# Patient Record
Sex: Male | Born: 1950 | Race: White | Hispanic: No | Marital: Married | Smoking: Former smoker
Health system: Southern US, Community
[De-identification: ages and names within clinical notes are randomized; demographics above are authoritative.]

## PROBLEM LIST (undated history)

## (undated) DIAGNOSIS — I4891 Unspecified atrial fibrillation: Secondary | ICD-10-CM

## (undated) DIAGNOSIS — G4733 Obstructive sleep apnea (adult) (pediatric): Secondary | ICD-10-CM

## (undated) DIAGNOSIS — R7303 Prediabetes: Secondary | ICD-10-CM

## (undated) DIAGNOSIS — U071 COVID-19: Secondary | ICD-10-CM

## (undated) DIAGNOSIS — H35039 Hypertensive retinopathy, unspecified eye: Secondary | ICD-10-CM

## (undated) DIAGNOSIS — M47816 Spondylosis without myelopathy or radiculopathy, lumbar region: Secondary | ICD-10-CM

## (undated) DIAGNOSIS — I10 Essential (primary) hypertension: Secondary | ICD-10-CM

## (undated) DIAGNOSIS — E782 Mixed hyperlipidemia: Secondary | ICD-10-CM

## (undated) HISTORY — DX: COVID-19: U07.1

## (undated) HISTORY — DX: Unspecified atrial fibrillation: I48.91

## (undated) HISTORY — DX: Prediabetes: R73.03

## (undated) HISTORY — DX: Mixed hyperlipidemia: E78.2

## (undated) HISTORY — DX: Essential (primary) hypertension: I10

## (undated) HISTORY — DX: Spondylosis without myelopathy or radiculopathy, lumbar region: M47.816

## (undated) HISTORY — PX: TONSILLECTOMY AND ADENOIDECTOMY: SUR1326

## (undated) HISTORY — PX: VASECTOMY: SHX75

## (undated) HISTORY — DX: Obstructive sleep apnea (adult) (pediatric): G47.33

## (undated) HISTORY — PX: REPLACEMENT TOTAL KNEE: SUR1224

## (undated) HISTORY — DX: Hypertensive retinopathy, unspecified eye: H35.039

## (undated) HISTORY — PX: CERVICAL SPINE SURGERY: SHX589

---

## 2005-03-15 ENCOUNTER — Ambulatory Visit (HOSPITAL_COMMUNITY): Admission: RE | Admit: 2005-03-15 | Discharge: 2005-03-15 | Payer: Self-pay | Admitting: Orthopedic Surgery

## 2007-11-13 ENCOUNTER — Ambulatory Visit: Payer: Self-pay | Admitting: Unknown Physician Specialty

## 2012-03-20 ENCOUNTER — Ambulatory Visit: Payer: Self-pay | Admitting: General Practice

## 2012-03-20 LAB — URINALYSIS, COMPLETE
Bilirubin,UR: NEGATIVE
Blood: NEGATIVE
Ketone: NEGATIVE
Leukocyte Esterase: NEGATIVE
Nitrite: NEGATIVE
Ph: 5 (ref 4.5–8.0)
Protein: NEGATIVE
RBC,UR: <1 /HPF (ref 0–5)
Specific Gravity: 1.02 (ref 1.003–1.030)
WBC UR: <1 /HPF (ref 0–5)

## 2012-03-20 LAB — BASIC METABOLIC PANEL
BUN: 20 mg/dL — ABNORMAL HIGH (ref 7–18)
Chloride: 107 mmol/L (ref 98–107)
Creatinine: 0.91 mg/dL (ref 0.60–1.30)
EGFR (African American): >60
EGFR (Non-African Amer.): >60
Glucose: 96 mg/dL (ref 65–99)
Osmolality: 289 (ref 275–301)
Potassium: 3.8 mmol/L (ref 3.5–5.1)
Sodium: 144 mmol/L (ref 136–145)

## 2012-03-20 LAB — APTT: Activated PTT: 26.4 secs (ref 23.6–35.9)

## 2012-03-20 LAB — CBC
MCH: 29.3 pg (ref 26.0–34.0)
MCHC: 32.5 g/dL (ref 32.0–36.0)
MCV: 90 fL (ref 80–100)
Platelet: 177 10*3/uL (ref 150–440)
RDW: 12.1 % (ref 11.5–14.5)

## 2012-03-20 LAB — SEDIMENTATION RATE: Erythrocyte Sed Rate: 4 mm/hr (ref 0–20)

## 2012-03-20 LAB — PROTIME-INR: INR: 0.8

## 2012-03-21 LAB — URINE CULTURE

## 2012-10-17 ENCOUNTER — Ambulatory Visit: Payer: Self-pay | Admitting: General Practice

## 2012-10-17 LAB — URINALYSIS, COMPLETE
Bilirubin,UR: NEGATIVE
Ketone: NEGATIVE
Leukocyte Esterase: NEGATIVE
Nitrite: NEGATIVE
Ph: 5 (ref 4.5–8.0)
Protein: NEGATIVE
RBC,UR: 1 /HPF (ref 0–5)
WBC UR: 2 /HPF (ref 0–5)

## 2012-10-17 LAB — BASIC METABOLIC PANEL
Anion Gap: 6 — ABNORMAL LOW (ref 7–16)
Calcium, Total: 8.9 mg/dL (ref 8.5–10.1)
Chloride: 109 mmol/L — ABNORMAL HIGH (ref 98–107)
Co2: 31 mmol/L (ref 21–32)
Creatinine: 0.74 mg/dL (ref 0.60–1.30)
Osmolality: 292 (ref 275–301)
Potassium: 3.7 mmol/L (ref 3.5–5.1)

## 2012-10-17 LAB — CBC
HGB: 15.5 g/dL (ref 13.0–18.0)
MCH: 29 pg (ref 26.0–34.0)
MCHC: 33.9 g/dL (ref 32.0–36.0)
MCV: 86 fL (ref 80–100)
Platelet: 170 10*3/uL (ref 150–440)
RBC: 5.36 10*6/uL (ref 4.40–5.90)
RDW: 14.5 % (ref 11.5–14.5)

## 2012-10-17 LAB — PROTIME-INR: Prothrombin Time: 12 secs (ref 11.5–14.7)

## 2012-10-17 LAB — MRSA PCR SCREENING

## 2012-10-19 LAB — URINE CULTURE

## 2012-11-01 ENCOUNTER — Inpatient Hospital Stay: Payer: Self-pay | Admitting: General Practice

## 2012-11-02 LAB — BASIC METABOLIC PANEL
Anion Gap: 6 — ABNORMAL LOW (ref 7–16)
Calcium, Total: 8.4 mg/dL — ABNORMAL LOW (ref 8.5–10.1)
EGFR (African American): >60
EGFR (Non-African Amer.): 58 — ABNORMAL LOW
Osmolality: 284 (ref 275–301)
Sodium: 142 mmol/L (ref 136–145)

## 2012-11-02 LAB — HEMOGLOBIN: HGB: 13 g/dL (ref 13.0–18.0)

## 2012-11-03 LAB — BASIC METABOLIC PANEL
Anion Gap: 2 — ABNORMAL LOW (ref 7–16)
BUN: 11 mg/dL (ref 7–18)
EGFR (African American): >60
EGFR (Non-African Amer.): >60
Glucose: 122 mg/dL — ABNORMAL HIGH (ref 65–99)
Osmolality: 275 (ref 275–301)
Potassium: 3.6 mmol/L (ref 3.5–5.1)

## 2012-11-03 LAB — HEMOGLOBIN: HGB: 12.4 g/dL — ABNORMAL LOW (ref 13.0–18.0)

## 2013-01-04 ENCOUNTER — Ambulatory Visit: Payer: Self-pay | Admitting: General Practice

## 2013-01-04 LAB — BASIC METABOLIC PANEL
Chloride: 107 mmol/L (ref 98–107)
Co2: 26 mmol/L (ref 21–32)
Creatinine: 0.74 mg/dL (ref 0.60–1.30)
EGFR (African American): >60
EGFR (Non-African Amer.): >60
Glucose: 96 mg/dL (ref 65–99)
Osmolality: 279 (ref 275–301)
Potassium: 3.6 mmol/L (ref 3.5–5.1)

## 2013-01-04 LAB — MRSA PCR SCREENING

## 2013-01-04 LAB — CBC
HCT: 44 % (ref 40.0–52.0)
MCH: 28.9 pg (ref 26.0–34.0)
MCHC: 33.8 g/dL (ref 32.0–36.0)
MCV: 86 fL (ref 80–100)
Platelet: 158 10*3/uL (ref 150–440)
RDW: 14 % (ref 11.5–14.5)
WBC: 8.5 10*3/uL (ref 3.8–10.6)

## 2013-01-04 LAB — APTT: Activated PTT: 28.1 secs (ref 23.6–35.9)

## 2013-01-04 LAB — URINALYSIS, COMPLETE
Nitrite: NEGATIVE
Ph: 7 (ref 4.5–8.0)
Specific Gravity: 1.009 (ref 1.003–1.030)
Squamous Epithelial: <1
WBC UR: NONE SEEN /HPF (ref 0–5)

## 2013-01-04 LAB — PROTIME-INR
INR: 0.9
Prothrombin Time: 12.5 secs (ref 11.5–14.7)

## 2013-01-17 ENCOUNTER — Inpatient Hospital Stay: Payer: Self-pay | Admitting: General Practice

## 2013-01-18 LAB — BASIC METABOLIC PANEL
Anion Gap: 6 — ABNORMAL LOW (ref 7–16)
BUN: 7 mg/dL (ref 7–18)
Calcium, Total: 8.3 mg/dL — ABNORMAL LOW (ref 8.5–10.1)
Chloride: 99 mmol/L (ref 98–107)
Co2: 30 mmol/L (ref 21–32)
EGFR (African American): >60
EGFR (Non-African Amer.): >60
Glucose: 137 mg/dL — ABNORMAL HIGH (ref 65–99)
Potassium: 3.5 mmol/L (ref 3.5–5.1)
Sodium: 135 mmol/L — ABNORMAL LOW (ref 136–145)

## 2013-01-18 LAB — HEMOGLOBIN: HGB: 13.3 g/dL (ref 13.0–18.0)

## 2013-01-19 LAB — BASIC METABOLIC PANEL
BUN: 11 mg/dL (ref 7–18)
Co2: 29 mmol/L (ref 21–32)
Creatinine: 1.05 mg/dL (ref 0.60–1.30)
EGFR (African American): >60
Glucose: 108 mg/dL — ABNORMAL HIGH (ref 65–99)
Potassium: 3.8 mmol/L (ref 3.5–5.1)
Sodium: 140 mmol/L (ref 136–145)

## 2013-01-19 LAB — HEMOGLOBIN: HGB: 12.9 g/dL — ABNORMAL LOW (ref 13.0–18.0)

## 2013-10-18 IMAGING — CR DG KNEE 1-2V*L*
1 series · 2 of 2 positions shown · non-contrast
Comparison: none

REASON FOR EXAM: postop
COMMENTS:   Bedside (portable):Y

PROCEDURE:     DXR - DXR KNEE LEFT AP AND LATERAL  - January 17, 2013 [DATE]
RESULT:     Comparison: None.

[Series 1: ap · 0.17mm/px · 2 of 2 slices shown]
[im 1/2]
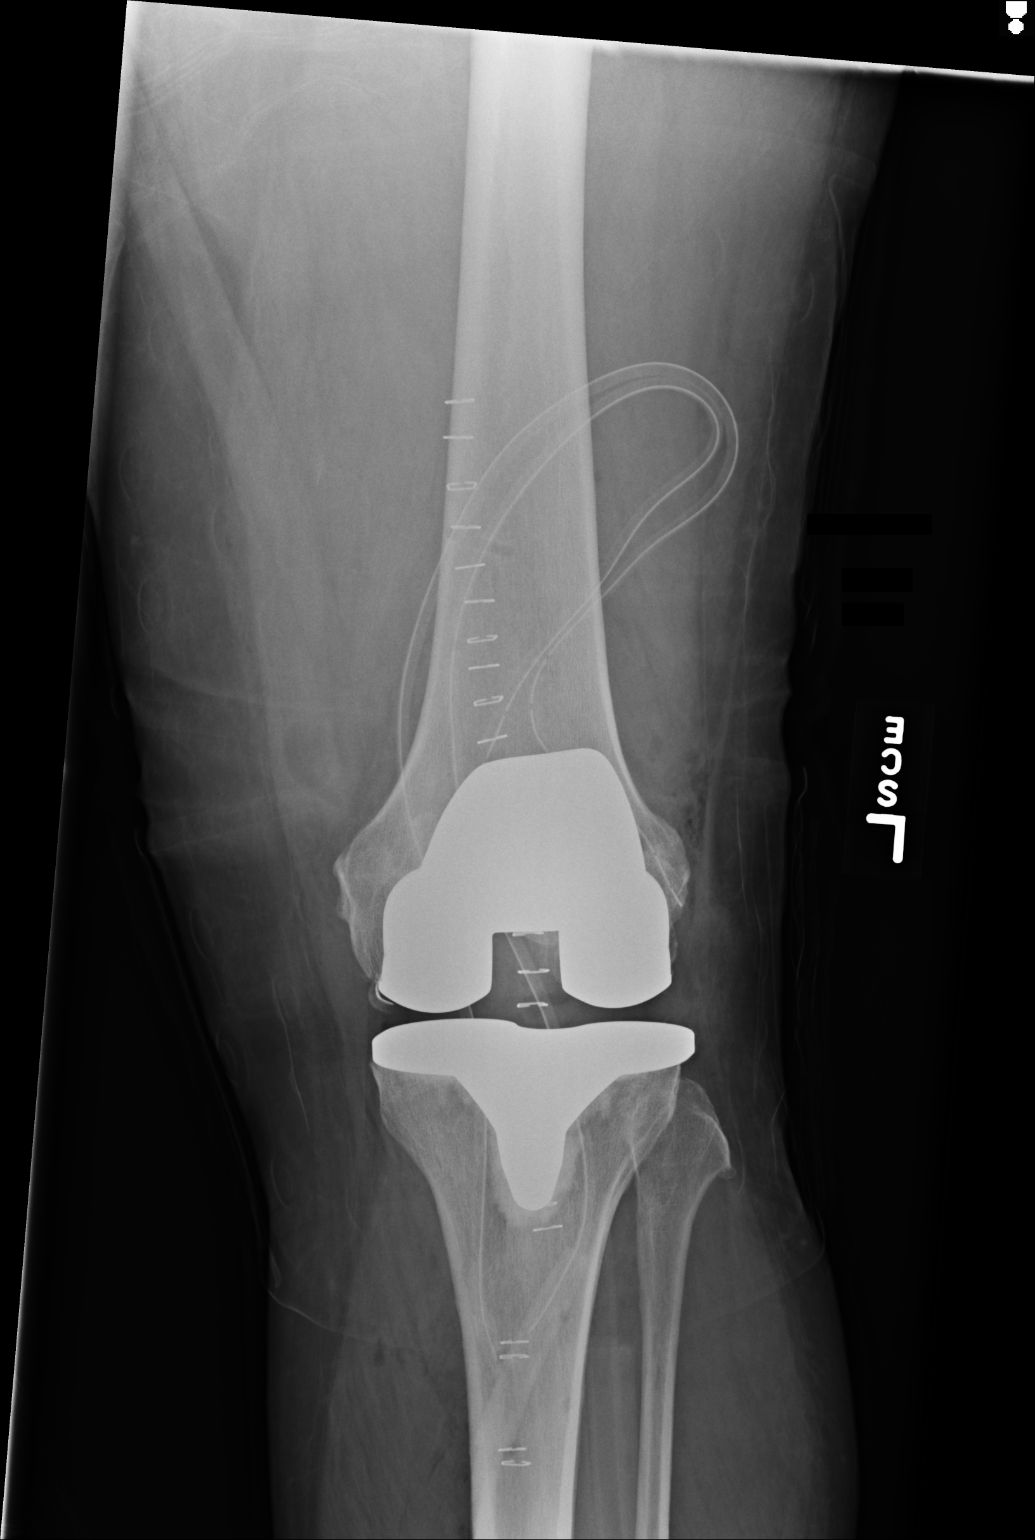
[im 2/2]
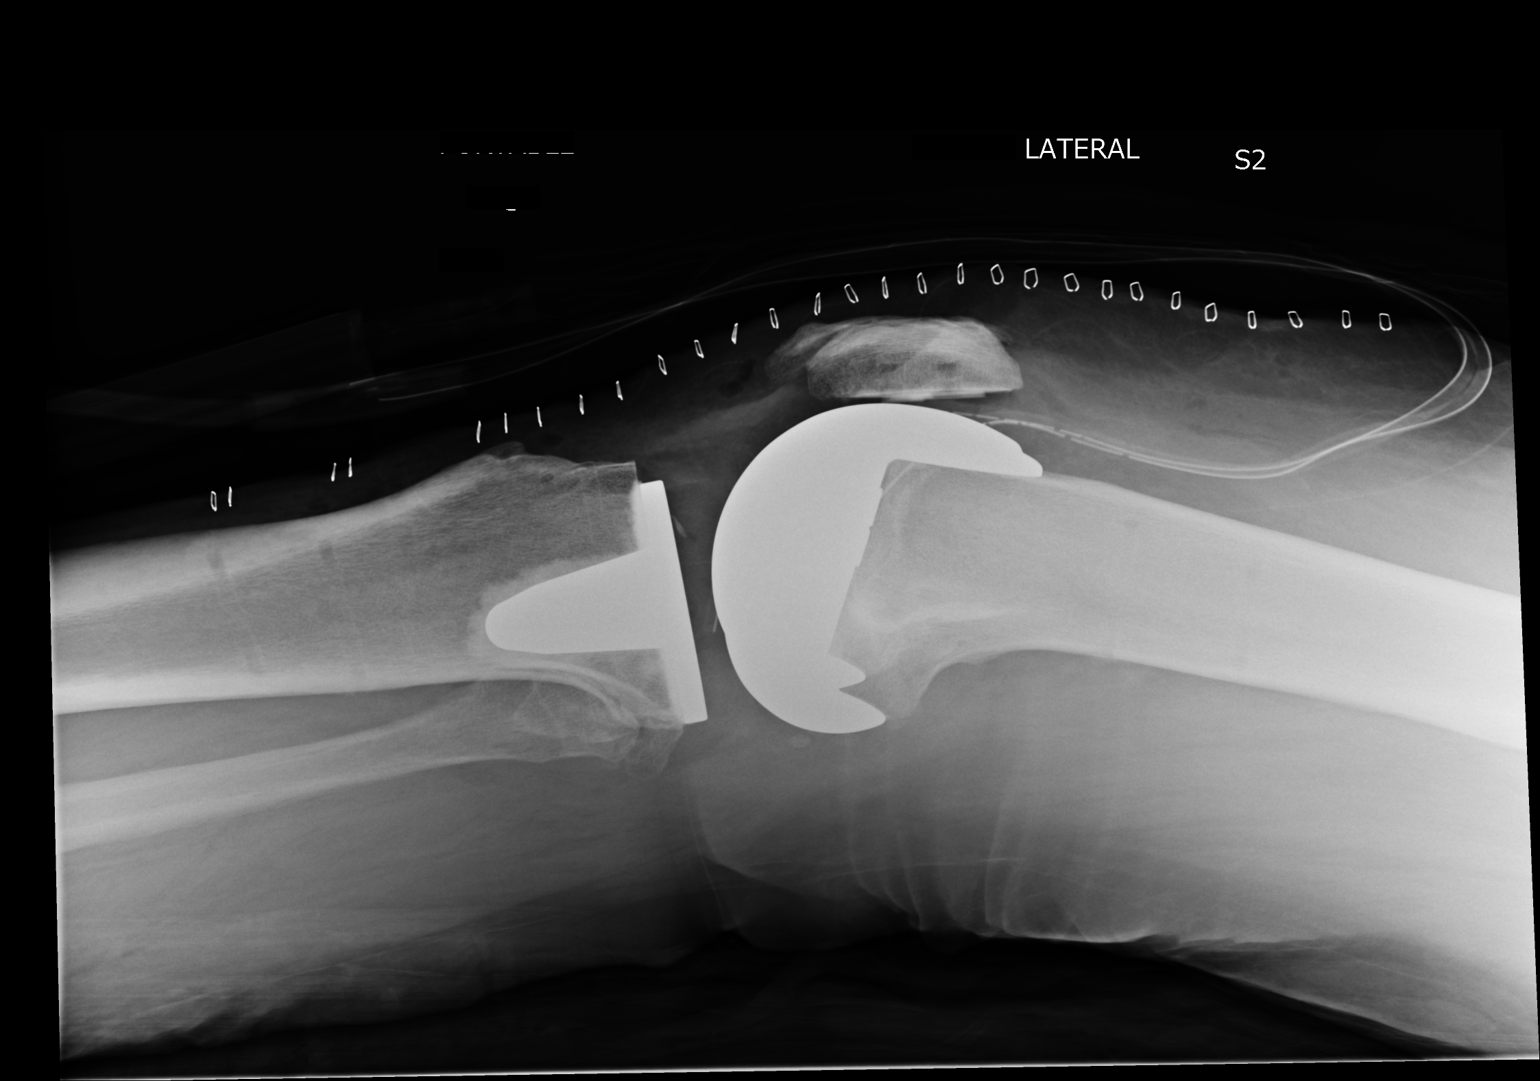

[2 of 2 positions shown; findings below may reference images not displayed]

FINDINGS: Hardware seen from total knee arthroplasty. Surgical drain and skin staples
are present. Soft tissue gas is likely postoperative.
IMPRESSION: Total knee arthroplasty.

[REDACTED]

## 2015-04-01 NOTE — Discharge Summary (Signed)
PATIENT NAME:  Gary Morales, Gary Morales MR#:  [id number] DATE OF BIRTH:  Oct 08, 1951  DATE OF ADMISSION:  11/01/2012 DATE OF DISCHARGE:  11/04/2012  Surgeon: Jeneen Rinks P. Holley Bouche., MD  ADMITTING DIAGNOSIS: Degenerative arthrosis of the right knee.   DISCHARGE DIAGNOSIS: Degenerative arthrosis of the right knee.   HISTORY: The patient is a 64 year old male who has been followed at [facility] for progression of right knee pain. He was initially scheduled to undergo surgery back in April of this year, but secondary to a fall down a set of steps and injured neck which required surgery, this was postponed. The patient has a long history of bilateral knee pain with the right being more symptomatic than the left. The patient states that he had had a remote history of knee arthroscopy to both knees. His pain was localized mostly along the medial aspect of the knee. He had reported some near giving way as well as occasional swelling of the knee. He denied any gross locking of the knee. At the time of surgery, he was not using any type of ambulatory aid. His pain was noted to be aggravated with weight-bearing activities. He was having difficulty with ambulating upstairs. The patient had not seen any improvement in his condition despite the use of anti-inflammatory medications, Synvisc and osteoarthritic brace. The pain had progressed to the point that it was significantly interfering with his activities of daily living. X-rays taken in [facility] showed narrowing of the medial cartilage space with associated varus alignment. There was now osteophyte as well as subchondral sclerosis. After discussion of the risks and benefits of surgical intervention, the patient expressed his understanding of the risks and benefits and agreed with plans for surgical intervention.   PROCEDURE: Right total knee arthroplasty using computer-assisted navigation.   Anesthesia: Femoral nerve block with spinal.   Soft tissue  release: Anterior cruciate ligament, posterior cruciate ligament, deep and superficial medial collateral ligaments, as well as the patellofemoral ligament.   Implants utilized: DePuy PFC Sigma size 5 posterior stabilized femoral component (cemented), size 4 MBT tibial component (cemented), 38 mm three pegged oval dome patella (cemented), and a 12.5 mm stabilized rotating platform polyethylene insert.   HOSPITAL COURSE: The patient tolerated the procedure very well. He had no complications. He was then taken to the PAC-U where he was stabilized and then transferred to the Orthopedic floor. The patient began receiving anticoagulation therapy of Lovenox 40 mg subcutaneous every 12 hours per Anesthesia and Pharmacy protocol. He was fitted with TED stockings bilaterally. These were allowed to be removed one hour per eight hour shift. The right one was applied on day two following removal of the Hemovac and dressing change. He was also fitted with the AV-I compression foot pumps set at 80 mmHg. His calves have been nontender. There has been no evidence of any deep venous thromboses of the lower extremity. Heels were elevated off the bed using rolled towels.   The patient has denied any chest pains or shortness of breath. Vital signs have been stable. He has been afebrile. Hemodynamically he was stable. No transfusions were given other than the Autovac transfusions given the first six hours postoperatively.   Physical therapy was initiated on day one for gait training and transfers. He has done very well. Upon being discharged, he was ambulating greater than 200 feet. He was independent with bed to chair transfers. He was able go up and down four sets of steps independently. Occupational therapy was also initiated  on day one for activities of daily living and assistive devices.   The patient's IV, Foley and Hemovac were all discontinued on day two along with a dressing change. The Polar Care was reapplied to the  surgical leg to maintain a temperature of 40 to 50 degrees Fahrenheit. The wound was free of any drainage or signs of infection.   DISPOSITION: The patient is being discharged to home in improved stable condition.   DISCHARGE INSTRUCTIONS:  1. He may weight bear as tolerated. Continue using a walker until cleared by Physical Therapy to go to a quad cane.  2. He will receive Home Health physical therapy.  3. Continue wearing the TED stockings. These are to be worn during the day but may be removed at night.  4. Continue using Polar Care pretty much around-the-clock except for therapy. Maintain a temperature of 40 to 50 degrees Fahrenheit.  5. He has a follow-up appointment in the clinic in two weeks.  6. He is to call the clinic sooner if any temperatures of 101.5 or greater. The physical therapist will evaluate the wound as needed.  7. He is placed on a regular diet.  8. He is to resume his regular medications that he was on prior to admission.  9. He was given a prescription for Roxicodone 5 to 10 mg every 4 to 6 hours p.r.n. for pain, Ultram 50 mg, 1 to 2 tablets every 4 to 6 hours p.r.n. for pain, and Lovenox 40 mg subcutaneously daily for 14 days, then discontinue and begin taking one enteric-coated aspirin per day.   PAST MEDICAL HISTORY:  1. Seasonal allergies.  2. Chickenpox. 3. Hernia.  4. Hypertension. 5. Arthritis.  6. Hemorrhoids.  7. Hypercholesterolemia.  8. Erectile dysfunction.  9. Hyperglycemia.   ____________________________ Vance Peper, PA [id number]:[id number] D: 11/03/2012 07:51:52 ET T: 11/03/2012 12:28:26 ET JOB#: [id number]  cc: Vance Peper, PA, <Dictator> JON WOLFE PA ELECTRONICALLY SIGNED 11/11/2012 19:40

## 2015-04-01 NOTE — Op Note (Signed)
PATIENT NAME:  Gary Morales, Gary Morales MR#:  [id number] DATE OF BIRTH:  [birth year]  DATE OF PROCEDURE:  11/01/2012  PREOPERATIVE DIAGNOSIS: Degenerative arthrosis of the right knee.   POSTOPERATIVE DIAGNOSIS: Degenerative arthrosis of the right knee.   PROCEDURE PERFORMED: Right total knee arthroplasty using computer-assisted navigation.   SURGEON: Laurice Record. Holley Bouche., MD    ASSISTANT: Vance Peper, PA-C (required to maintain retraction throughout the procedure)   ANESTHESIA: Femoral nerve block and spinal.   ESTIMATED BLOOD LOSS: 75 mL.   FLUIDS REPLACED: 2200 mL of Crystalloid.   TOURNIQUET TIME: 109 minutes.   DRAINS: Two medium drains to reinfusion system.   SOFT TISSUE RELEASES: Anterior cruciate ligament, posterior cruciate ligament, deep and superficial medial collateral ligament, and patellofemoral ligament.   IMPLANTS UTILIZED: DePuy PFC Sigma size 5 posterior stabilized femoral component (cemented), size 4 MBT tibial component (cemented), 38 mm three peg oval dome patella (cemented), and a 12.5 mm stabilized rotating platform polyethylene insert.   INDICATIONS FOR SURGERY: The patient is a 64 year old male who has been seen for complaints of severe progressive right knee pain. X-rays demonstrated severe degenerative changes in tricompartmental fashion with relative varus alignment. The patient had not seen any significant improvement despite conservative nonsurgical intervention. After discussion of the risks and benefits of surgical intervention, the patient expressed his understanding of the risks and benefits and agreed with plans for surgical intervention.   PROCEDURE IN DETAIL: The patient was brought in the operating room and, after adequate femoral nerve block and spinal anesthesia was achieved, a tourniquet was placed on the patient's upper right thigh. The patient's right knee and leg were cleaned and prepped with alcohol and DuraPrep and draped in the usual sterile fashion. A  "time-out" was performed as per usual protocol. The right lower extremity was exsanguinated using an Esmarch and the tourniquet was inflated to 300 mmHg. Anterior longitudinal incision was made followed by a standard mid vastus approach. A large effusion was evacuated. The deep fibers of the medial collateral ligament were elevated in a subperiosteal fashion off the medial flare of the tibia so as to maintain a continuous soft tissue sleeve. The patella was subluxed laterally and the patellofemoral ligament was incised. Inspection of the knee demonstrated severe tricompartmental degenerative changes with prominent osteophytes and full thickness loss of articular cartilage. Osteophytes were debrided using a rongeur. Anterior and posterior cruciate ligaments were excised. Two 4.0 mm Schanz pins were inserted into the femur and into the tibia for attachment of the array of trackers used for computer-assisted navigation. Hip center was identified using circumduction technique. Distal landmarks were mapped using the computer. Distal femur and proximal tibia were mapped using the computer. Distal femoral cutting guide was positioned using computer-assisted navigation so as to achieve a 5 degree distal valgus cut. Cut was performed and verified using the computer. Distal femur was sized and it was felt that a size 5 femoral component was appropriate. Size 5 cutting guide was positioned and anterior cut was performed and verified using the computer. This was followed by completion of the posterior and chamfer cuts. Femoral cutting guide for the central box was then positioned and central box cut was performed.   Attention was then directed to the proximal tibia. Medial and lateral menisci were excised. The extramedullary tibial cutting guide was positioned using computer-assisted navigation so as to achieve 0 degree varus valgus alignment and 0 degree posterior slope. Cut was performed and verified using the computer.  Proximal tibia was sized  and it was felt that a size 4 tibial tray was appropriate. Tibial and femoral trials were inserted followed by insertion of a 10 mm polyethylene insert. The knee was felt to be tight medially. A Cobb elevator was used to elevate the superficial fibers of the medial collateral ligament. The 10 mm polyethylene was replaced by a 12.5 mm polyethylene trial. This allowed for excellent mediolateral soft tissue balancing both in full extension and in flexion. Finally, the patella was cut and prepared so as to accommodate a 38 mm three peg oval dome patella. Patellar trial was placed and the knee was placed through a range of motion. Excellent patellar tracking was appreciated.   Polyethylene trial was removed. Osteotome was used to remove osteophytes off of the posterior condyles of the femur. Femoral trial was then removed. Central post hole for the tibial component reamed followed by insertion of a keel punch. Tibial trials were then removed. Cut surfaces of bone were irrigated with copious amounts of normal saline with antibiotic solution using pulsatile lavage and then suctioned dry. Polymethyl methacrylate cement was prepared in the usual fashion using a vacuum mixer. Cement was applied to the cut surface of the proximal tibia as well as along the undersurface of a size 4 MBT tibial component. Tibial component was positioned and impacted into place. Excess cement was removed using freer elevators. Cement was then applied to the cut surface of the femur as well as along the posterior flanges of a size 5 posterior stabilized femoral component. Femoral component was positioned and impacted into place. Excess cement was removed using freer elevators. A 12.5 mm polyethylene trial was inserted and the knee was brought in full extension with steady axial compression applied. Finally, cement was applied to the backside of a 38 mm three peg oval dome patella and patellar component was positioned and  patellar clamp applied. Excess cement was removed using freer elevators.   After adequate curing of cement, the tourniquet was deflated after a total tourniquet time of 109 minutes. Hemostasis was achieved using electrocautery. The knee was irrigated with copious amounts of normal saline with antibiotic solution using pulsatile lavage and then suctioned dry. The knee was inspected for any cement debris. 30 mL of 0.25% Marcaine with epinephrine was injected along the posterior capsule. A 12.5 mm stabilized rotating platform polyethylene insert was inserted and the knee was placed through a range of motion. Excellent mediolateral soft tissue balancing was appreciated both in full extension and in flexion. Excellent patellar tracking was noted.   Two medium drains were placed in the wound bed and brought out through a separate stab incision to be attached to a reinfusion system. The medial parapatellar portion of the incision was reapproximated using interrupted sutures of #1 Vicryl. Subcutaneous tissue was approximated in layers using first #0 Vicryl followed #2-0 Vicryl. Skin was closed with skin staples. Sterile dressing was applied.   The patient tolerated the procedure well. He was transported to the recovery room in stable condition.   ____________________________ Laurice Record. Holley Bouche., MD jph:drc D: 11/02/2012 06:14:31 ET T: 11/02/2012 10:00:12 ET JOB#: [id number]  cc: Laurice Record. Holley Bouche., MD, <Dictator> Laurice Record Holley Bouche MD ELECTRONICALLY SIGNED 11/03/2012 17:59

## 2015-04-04 NOTE — Discharge Summary (Signed)
PATIENT NAME:  Gary Morales, Gary Morales MR#:  [id number] DATE OF BIRTH:  11-Sep-1951  DATE OF ADMISSION:  01/17/2013 DATE OF DISCHARGE:  01/19/2013  DICTATING FOR: Jeneen Rinks P. Holley Bouche., MD  ADMITTING DIAGNOSIS: Degenerative arthrosis of left knee.   DISCHARGE DIAGNOSIS: Degenerative arthrosis of left knee.   HISTORY: The patient is a pleasant 64 year old who has been followed at [facility] for progression of bilateral knee pains. He had previously undergone a right total knee arthroplasty in November 2013 and was doing extremely well. However, he continued to have discomfort to the left knee and decided to proceed with surgery on that knee. He had localized most of the pain along the medial aspect of the knee. His pain was noted to be aggravated with weightbearing activities. On occasion, the patient was noted to have some swelling. He states that his left knee has now become his primary limiting factor on his returning to his everyday activities. X-rays taken in the [facility] showed narrowing of the medial cartilage space with associated varus alignment. He was noted to have osteophyte as well as subchondral sclerosis. After discussion of the risks and benefits of surgical intervention, the patient expressed his understanding of the risks and benefits and agreed for plans for surgical intervention.   HOSPITAL COURSE:  PROCEDURE: Left total knee arthroplasty using computer-assisted navigation.   ANESTHESIA: Femoral nerve block with spinal.   SOFT TISSUE RELEASE: Anterior cruciate ligament, posterior cruciate ligament, deep medial collateral ligaments as well as the patellofemoral ligament.   IMPLANTS UTILIZED: DePuy PFC Sigma size 5 posterior stabilized femoral component (cemented), size 5 MBT tibial component (cemented), 38 mm 3-pegged oval dome patella (cemented), and a 10 mm stabilized rotating platform polyethylene insert.   The patient tolerated the procedure very  well. He had no complications. He was then taken to the PACU, where he was stabilized and then transferred to the orthopedic floor. The patient began receiving anticoagulation therapy of Lovenox 30 mg subcutaneous q.12 hours per anesthesia and pharmacy protocol. He was fitted with TED stockings bilaterally. These were allowed to be removed 1 hour per 8-hour shift. He was also fitted with the AVI compression foot pumps bilaterally set at 80 mmHg. His calves have been nontender. There has been no evidence of any DVTs to the lower extremities. Homans signs were negative. Heels were elevated off the bed using rolled towels.   The patient denied any chest pain or shortness of breath. Vital signs have been stable. He has been afebrile. Hemodynamically, no transfusions were given other than the Autovac transfusion given the first 6 hours postoperatively.    Physical therapy was initiated on day 1 for gait training and transfers. He has done extremely well. He has had no complications. Occupational therapy was also initiated on day 1 for ADLs and assistive devices.   The patient's IV, Foley and Hemovac were discontinued on day 2 along with a dressing change. The Polar Care was reapplied to the surgical leg, maintaining a temperature of 40 to 50 degrees Fahrenheit.   DISPOSITION: The patient is being discharged to home in improved stable condition.   DISCHARGE INSTRUCTIONS:  1. He may weight bear as tolerated. Continue using a walker until cleared by physical therapy to go to a quad cane.  2. He is also to continue with TED stockings. These are to be worn during the day, but may be removed at night.  3. He will receive home health PT.  4. Continue with Polar Care,  maintain temperature of 40 to 50 degrees Fahrenheit to the surgical leg. Recommend that he wear this around the clock as much as possible for the first 2 weeks.  5. The patient has a followup appointment in clinic on February 20th. He is to follow up  in clinic sooner if any temperatures of 101.5 or greater or excessive bleeding.  6. He is placed on a regular diet.  7. He is to resume his regular medication that he was on prior to admission.  8. He was given a prescription for oxycodone 5 to 10 mg q.4-6 hours p.r.n. for pain, tramadol 50 to 100 mg q.4-6 hours p.r.n. for pain and Lovenox 30 mg subcutaneously q.12 hours for 14 days and then discontinue and begin taking one 81 mg enteric-coated aspirin.   PAST MEDICAL HISTORY: Seasonal allergies, chickenpox, hernias, hypertension, arthritis, hemorrhoids, hypercholesterolemia, erectile dysfunction, hyperglycemia.   ____________________________ Vance Peper, PA jrw:OSi D: 01/19/2013 16:25:41 ET T: 01/20/2013 07:25:17 ET JOB#: [id number]  cc: Vance Peper, PA, <Dictator> Johm Pfannenstiel PA ELECTRONICALLY SIGNED 01/22/2013 7:38

## 2015-04-04 NOTE — Op Note (Signed)
PATIENT NAME:  Gary Morales, Gary Morales MR#:  [id number] DATE OF BIRTH:  April 10, 1951  DATE OF PROCEDURE:  01/17/2013  PREOPERATIVE DIAGNOSIS: Degenerative arthrosis of the left knee.   POSTOPERATIVE DIAGNOSIS: Degenerative arthrosis of the left knee.   PROCEDURE PERFORMED: Left total knee arthroplasty using computer-assisted navigation.   SURGEON: Rolene Arbour, M.D.   ASSISTANT: Vance Peper, PA-C (required to maintain retraction throughout the procedure)   ANESTHESIA: Femoral nerve block and spinal.   ESTIMATED BLOOD LOSS: 100 mL.   FLUIDS REPLACED:  2000 mL of crystalloid.   TOURNIQUET TIME:  95 minutes.   DRAINS: Two medium drains to reinfusion system.   SOFT TISSUE RELEASES:  Anterior cruciate ligament, posterior cruciate ligament, deep medial collateral ligament and patellofemoral ligament.   IMPLANTS UTILIZED:  DePuy PFC Sigma size 5 posterior stabilized femoral component (cemented), size 5 MBT tibial component (cemented), 38 mm, 3-peg oval dome patella (cemented) and a 10 mm stabilized rotating platform polyethylene insert.   INDICATIONS FOR SURGERY:  The patient is a 64 year old gentleman who has been seen for complaints of progressive left knee pain. X-rays demonstrated severe degenerative changes in tricompartmental fashion with relative varus deformity. The patient previously underwent right total knee arthroplasty with excellent results. After discussion of the risks and benefits of surgical intervention, the patient expressed understanding of the risks, benefits and agreed with plans for surgical intervention.   PROCEDURE IN DETAIL: The patient in the operating room and, after adequate femoral nerve block and spinal anesthesia was achieved, a tourniquet was placed on the patient's upper left thigh. The patient's left knee and leg were cleaned and prepped with alcohol and DuraPrep and draped in the usual sterile fashion. A "timeout" was performed as per usual protocol. The left  lower extremity was exsanguinated using an Esmarch and the tourniquet was inflated to 300 mmHg.  An anterior longitudinal incision was made followed by a standard mid vastus approach. A moderate effusion was evacuated. The deep fibers of the medial collateral ligament were elevated in a subperiosteal fashion off the medial flare of the tibia so as to maintain a continuous soft tissue sleeve. The patella was subluxed laterally and the patellofemoral ligament was incised. Inspection of the knee demonstrated severe degenerative changes with full thickness loss of articular cartilage to both the medial, lateral and patellofemoral compartments. Prominent osteophytes were debrided using a rongeur. Anterior and posterior cruciate ligaments were excised. Two 4.0 mm Schanz pins were inserted into the femur and into the tibia for attachment of the array of trackers used for computer-assisted navigation. Hip center was identified using circumduction technique. Distal landmarks were mapped using the computer. The distal femur and proximal tibia were mapped using the computer.   Distal femoral cutting guide was positioned using computer-assisted navigation so as to achieve a 5-degree distal valgus cut. Cut was performed and verified using the computer. Distal femur was sized and it was felt that a size 5 femoral component was appropriate. A size five cutting 5 guide was positioned and the anterior cut was performed and verified using the computer. This was followed by completion of the posterior and chamfer cuts. Femoral cutting guide for central box was then positioned and the central box cut was performed.   Attention was then directed to the proximal tibia. Medial and lateral menisci were excised. The extramedullary tibial cutting guide was positioned using computer-assisted navigation so as to achieve a 0-degree varus valgus alignment and 0-degree posterior slope. Cut was performed and verified using the  computer. The  proximal tibia was sized and it was felt that a size 5 tibial tray was appropriate. Tibial and femoral trials were inserted followed by insertion of a 10 mm polyethylene trial. Excellent medial and lateral soft tissue balancing was appreciated both in full extension and in flexion. Finally, the patella was cut and prepared so as to accommodate a 38 mm, 3-peg oval dome patella. Patellar trial was placed and the knee was placed through a range of motion with excellent patellar tracking appreciated.   Femoral trial was removed after debridement of posterior osteophytes. The central post hole for the tibial component was reamed followed by insertion of a keel punch. Tibial tray was then removed. The cut surfaces of bone were irrigated with copious amounts of normal saline with antibiotic solution using pulsatile lavage and then suctioned dry. Polymethyl methacrylate cement with gentamicin was prepared in the usual fashion using a vacuum mixer. Cement was applied to the cut surface of the proximal tibia as well as along the undersurface of the size 5 MBT tibial component. The tibial component was positioned and impacted into place. Excess cement was removed using freer elevators. Cement was then applied to the cut surfaces of the femur as well as along the posterior flanges of a size 5 posterior stabilized femoral component. Femoral component was positioned and impacted into place. Excess cement was removed using freer elevators. A 10 mm polyethylene trial was inserted and the knee was brought into full extension with steady axial compression applied. Finally, cement was applied to the backside of a 38 mm, 3-peg oval dome patella and the patellar component was positioned and patellar clamp applied. Excess cement was removed using freer elevators.   After adequate curing of cement, the tourniquet was deflated after a total tourniquet time of 95 minutes. Hemostasis was achieved using electrocautery. The knee was  irrigated with copious amounts of normal saline with antibiotic solution using pulsatile lavage and then suctioned dry. The knee was inspected for any residual cement debris. 30 mL of 0.25% Marcaine with epinephrine was injected along the posterior capsule. A 10 mm stabilized rotating platform polyethylene insert was inserted and the knee was placed through a range of motion with excellent mediolateral soft tissue balancing appreciated and excellent patellar tracking noted. Two medium drains were placed in the wound bed and brought out through a separate stab incision to be attached to a reinfusion system. The medial parapatellar portion of the incision was reapproximated using a suture of #1 Vicryl. The subcutaneous tissue was approximated in layers using first #0 Vicryl followed by 2-0 Vicryl. Skin was closed with skin staples. A sterile dressing was applied. The patient tolerated the procedure well. He was transported to the recovery room in stable condition.    ____________________________ Laurice Record. Holley Bouche., MD jph:ct D: 01/17/2013 11:23:33 ET T: 01/17/2013 11:54:04 ET JOB#: [id number]  cc: Jeneen Rinks P. Holley Bouche., MD, <Dictator> Laurice Record Holley Bouche MD ELECTRONICALLY SIGNED 01/19/2013 17:44

## 2015-12-14 HISTORY — PX: CATARACT EXTRACTION: SUR2

## 2019-12-28 ENCOUNTER — Other Ambulatory Visit: Payer: Self-pay

## 2019-12-28 ENCOUNTER — Encounter (INDEPENDENT_AMBULATORY_CARE_PROVIDER_SITE_OTHER): Payer: Self-pay | Admitting: Ophthalmology

## 2019-12-28 ENCOUNTER — Ambulatory Visit (INDEPENDENT_AMBULATORY_CARE_PROVIDER_SITE_OTHER): Payer: Managed Care, Other (non HMO) | Admitting: Ophthalmology

## 2019-12-28 DIAGNOSIS — H3581 Retinal edema: Secondary | ICD-10-CM

## 2019-12-28 DIAGNOSIS — H35033 Hypertensive retinopathy, bilateral: Secondary | ICD-10-CM

## 2019-12-28 DIAGNOSIS — I10 Essential (primary) hypertension: Secondary | ICD-10-CM

## 2019-12-28 DIAGNOSIS — H4312 Vitreous hemorrhage, left eye: Secondary | ICD-10-CM | POA: Diagnosis not present

## 2019-12-28 DIAGNOSIS — H43812 Vitreous degeneration, left eye: Secondary | ICD-10-CM

## 2019-12-28 DIAGNOSIS — Z961 Presence of intraocular lens: Secondary | ICD-10-CM

## 2019-12-28 NOTE — Progress Notes (Signed)
[facility] Note  12/28/2019     CHIEF COMPLAINT Patient presents for Retina Evaluation   HISTORY OF PRESENT ILLNESS: Gary Morales is a 69 y.o. male who presents to the clinic today for:   HPI    Retina Evaluation    In left eye.  This started 1 week ago.  Duration of 1 week.  Associated Symptoms Floaters and Flashes.  Negative for Blind Spot, Photophobia, Scalp Tenderness, Fever, Pain, Glare, Jaw Claudication, Weight Loss, Distortion, Redness, Trauma, Shoulder/Hip pain and Fatigue.  Context:  distance vision, mid-range vision and near vision.  Treatments tried include no treatments.  I, the attending physician,  performed the HPI with the patient and updated documentation appropriately.          Comments    Patient states had sudden onset of flashes OS, about 1 week ago. Flashes lasted for about 2 hours. 1-2 days later, patient started having hazy vision OS and it looked like spider webs floating in vision OS. Saw doctor in [place] 2 days ago, vision was OK in both eyes. Patient states spider webs in vision are about the same as when symptoms started. Had high Rx for glasses prior to cataract surgery but he doesn't remember if he was near of far-sighted.       Last edited by Bernarda Caffey, MD on 12/28/2019  9:00 AM. (History)    pt is here on the referral of Dr. Rosana Hoes for sudden on set flashes and floaters, pt states he has had floaters since 2017 when Dr. Herbert Deaner did cataract sx (OU) on him, pt states he had a routine exam with Dr. Radford Pax on a Thursday, he states on Friday he started noticing a "golden halo" in his left eye vision that only lasted for an hour, he states Saturday he starting noticing floaters that looked like a "spider web", he went back to Dr. Theodosia Blender office on Wednesday and they told him that he has significantly more floaters in his left eye than he had the week before  Referring physician: Leticia Clas, [facility]  [address],  [place] [address]  HISTORICAL INFORMATION:   Selected notes from the MEDICAL RECORD NUMBER    CURRENT MEDICATIONS: Current Outpatient Medications (Ophthalmic Drugs)  Medication Sig  . tetrahydrozoline 0.05 % ophthalmic solution Place into both eyes as needed.   No current facility-administered medications for this visit. (Ophthalmic Drugs)   Current Outpatient Medications (Other)  Medication Sig  . amLODipine-valsartan (EXFORGE) 5-320 MG tablet Take by mouth as directed.  Marland Kitchen aspirin 81 MG EC tablet Take 81 mg by mouth daily.  Marland Kitchen doxycycline (VIBRA-TABS) 100 MG tablet Take 100 mg by mouth daily.  . hydrochlorothiazide (HYDRODIURIL) 25 MG tablet Take 25 mg by mouth daily.  Marland Kitchen losartan (COZAAR) 100 MG tablet Take 100 mg by mouth daily.  . metroNIDAZOLE (METROGEL) 1 % gel as directed.  . Multiple Vitamin (MULTI-VITAMIN) tablet Take by mouth daily.  . predniSONE (STERAPRED UNI-PAK 21 TAB) 10 MG (21) TBPK tablet Take 10 mg by mouth daily.  . rosuvastatin (CRESTOR) 10 MG tablet Take 10 mg by mouth at bedtime.   No current facility-administered medications for this visit. (Other)      REVIEW OF SYSTEMS: ROS    Positive for: Skin, Eyes   Negative for: Constitutional, Gastrointestinal, Neurological, Genitourinary, Musculoskeletal, HENT, Endocrine, Cardiovascular, Respiratory, Psychiatric, Allergic/Imm, Heme/Lymph   Last edited by Roselee Nova D, COT on 12/28/2019  8:28  AM. (History)       ALLERGIES No Known Allergies  PAST MEDICAL HISTORY Past Medical History:  Diagnosis Date  . Hypertensive retinopathy    Past Surgical History:  Procedure Laterality Date  . CATARACT EXTRACTION Bilateral 2017   hecker, toric implants OU    FAMILY HISTORY Family History  Problem Relation Age of Onset  . Diabetes Father     SOCIAL HISTORY Social History   Tobacco Use  . Smoking status: Former Research scientist (life sciences)  . Smokeless tobacco: Former Network engineer Use Topics  . Alcohol use: Yes     Alcohol/week: 2.0 standard drinks    Types: 2 Cans of beer per week    Comment: daily  . Drug use: Not on file         OPHTHALMIC EXAM:  Base Eye Exam    Visual Acuity (Snellen - Linear)      Right Left   Dist [place] 20/30 -2 20/40 +1   Dist ph [place] 20/25 -2 20/30       Tonometry (Tonopen, 8:37 AM)      Right Left   Pressure 18 14       Pupils      Dark Light Shape React APD   Right 4 3 Round Slow None   Left 4 3 Round Slow None       Visual Fields (Counting fingers)      Left Right    Full Full       Extraocular Movement      Right Left    Full, Ortho Full, Ortho       Neuro/Psych    Oriented x3: Yes   Mood/Affect: Normal       Dilation    Both eyes: 1.0% Mydriacyl, 2.5% Phenylephrine @ 8:37 AM        Slit Lamp and Fundus Exam    Slit Lamp Exam      Right Left   Lids/Lashes Dermatochalasis - upper lid Dermatochalasis - upper lid   Conjunctiva/Sclera White and quiet White and quiet   Cornea Mild Arcus, mild EBMD, well healed temporal cataract wounds Mild Arcus, mild EBMD, well healed superior cataract wounds   Anterior Chamber Deep and quiet Deep and quiet   Iris Round and dilated Round and dilated   Lens PC IOL in good position, 1+ Posterior capsular opacification PC IOL in good position, trace Posterior capsular opacification   Vitreous Vitreous syneresis Vitreous syneresis, +RBC ant vit, Posterior vitreous detachment, Weiss ring, vitreous condensations       Fundus Exam      Right Left   Disc Pink and Sharp Pink and Sharp   C/D Ratio 0.6 0.65   Macula Flat, Good foveal reflex, Retinal pigment epithelial mottling, No heme or edema Flat, blunted foveal reflex, Retinal pigment epithelial mottling, drusen, No heme or edema   Vessels Mild Vascular attenuation, Tortuousity, mild AV crossing changes Mild Vascular attenuation, Tortuousity, mild AV crossing changes   Periphery Attached    Attached, punctate IRH at 0100         Refraction    Manifest  Refraction      Sphere Cylinder Axis Dist VA   Right -0.25 +1.00 142 20/25   Left -2.50 +1.00 080 20/30+1          IMAGING AND PROCEDURES  Imaging and Procedures for @TODAY @  OCT, Retina - OU - Both Eyes       Right Eye Quality was good. Central Foveal Thickness: 263.  Progression has no prior data. Findings include normal foveal contour, no IRF, no SRF.   Left Eye Quality was good. Central Foveal Thickness: 259. Progression has no prior data. Findings include normal foveal contour, no IRF, no SRF, retinal drusen  (Rare drusen, vitreous opacities).   Notes *Images captured and stored on drive  Diagnosis / Impression:  NFP, no IRF/SRF OU OS: +vitreous opacities  Clinical management:  See below  Abbreviations: NFP - Normal foveal profile. CME - cystoid macular edema. PED - pigment epithelial detachment. IRF - intraretinal fluid. SRF - subretinal fluid. EZ - ellipsoid zone. ERM - epiretinal membrane. ORA - outer retinal atrophy. ORT - outer retinal tubulation. SRHM - subretinal hyper-reflective material                 ASSESSMENT/PLAN:    ICD-10-CM   1. Posterior vitreous detachment of left eye  H43.812   2. Vitreous hemorrhage of left eye (HCC)  H43.12   3. Retinal edema  H35.81 OCT, Retina - OU - Both Eyes  4. Essential hypertension  I10   5. Hypertensive retinopathy of both eyes  H35.033   6. Pseudophakia of both eyes  Z96.1     1,2. Hemorrhagic PVD OS  - Onset ~1 wk -- presented to Dr. Leander Rams on 01.13.21  - Discussed findings and prognosis  - No RT or RD on 360 scleral depressed exam  - Reviewed s/s of RT/RD  - Strict return precautions for any such RT/RD signs/symptoms  - VH precautions reviewed -- minimize activities, keep head elevated, avoid ASA/NSAIDs/blood thinners as able  - F/U 3-4 weeks -- DFE/OCT  3. No retinal edema on exam or OCT  4,5. Hypertensive retinopathy OU  - discussed importance of tight BP control  - monitor  6. Pseudophakia  OU  - s/p CE/IOL OU (Dr. Herbert Deaner, 2017)  - doing well  - monitor   Ophthalmic Meds Ordered this visit:  No orders of the defined types were placed in this encounter.      Return for f/u 3-4 weeks, hemorrhagic PVD OS, DFE, OCT.  There are no Patient Instructions on file for this visit.   Explained the diagnoses, plan, and follow up with the patient and they expressed understanding.  Patient expressed understanding of the importance of proper follow up care.   This document serves as a record of services personally performed by Gardiner Sleeper, MD, PhD. It was created on their behalf by Ernest Mallick, OA, an ophthalmic assistant. The creation of this record is the provider's dictation and/or activities during the visit.    Electronically signed by: Ernest Mallick, OA 01.15.2021 9:11 AM   Gardiner Sleeper, M.D., Ph.D. Diseases & Surgery of the Retina and Vitreous [facility]  I have reviewed the above documentation for accuracy and completeness, and I agree with the above. Gardiner Sleeper, M.D., Ph.D. 12/30/19 9:11 AM    Abbreviations: M myopia (nearsighted); A astigmatism; H hyperopia (farsighted); P presbyopia; Mrx spectacle prescription;  CTL contact lenses; OD right eye; OS left eye; OU both eyes  XT exotropia; ET esotropia; PEK punctate epithelial keratitis; PEE punctate epithelial erosions; DES dry eye syndrome; MGD meibomian gland dysfunction; ATs artificial tears; PFAT's preservative free artificial tears; [place] nuclear sclerotic cataract; PSC posterior subcapsular cataract; ERM epi-retinal membrane; PVD posterior vitreous detachment; RD retinal detachment; DM diabetes mellitus; DR diabetic retinopathy; NPDR non-proliferative diabetic retinopathy; PDR proliferative diabetic retinopathy; CSME clinically significant macular edema; DME diabetic macular  edema; dbh dot blot hemorrhages; CWS cotton wool spot; POAG primary open angle glaucoma; C/D cup-to-disc ratio; HVF  humphrey visual field; GVF goldmann visual field; OCT optical coherence tomography; IOP intraocular pressure; BRVO Branch retinal vein occlusion; CRVO central retinal vein occlusion; CRAO central retinal artery occlusion; BRAO branch retinal artery occlusion; RT retinal tear; SB scleral buckle; PPV pars plana vitrectomy; VH Vitreous hemorrhage; PRP panretinal laser photocoagulation; IVK intravitreal kenalog; VMT vitreomacular traction; MH Macular hole;  NVD neovascularization of the disc; NVE neovascularization elsewhere; AREDS age related eye disease study; ARMD age related macular degeneration; POAG primary open angle glaucoma; EBMD epithelial/anterior basement membrane dystrophy; ACIOL anterior chamber intraocular lens; IOL intraocular lens; PCIOL posterior chamber intraocular lens; Phaco/IOL phacoemulsification with intraocular lens placement; [place] photorefractive keratectomy; LASIK laser assisted in situ keratomileusis; HTN hypertension; DM diabetes mellitus; COPD chronic obstructive pulmonary disease

## 2020-01-22 ENCOUNTER — Encounter (INDEPENDENT_AMBULATORY_CARE_PROVIDER_SITE_OTHER): Payer: Managed Care, Other (non HMO) | Admitting: Ophthalmology

## 2020-02-27 ENCOUNTER — Ambulatory Visit (INDEPENDENT_AMBULATORY_CARE_PROVIDER_SITE_OTHER): Payer: 59 | Admitting: Ophthalmology

## 2020-02-27 ENCOUNTER — Encounter (INDEPENDENT_AMBULATORY_CARE_PROVIDER_SITE_OTHER): Payer: Self-pay | Admitting: Ophthalmology

## 2020-02-27 VITALS — BP 159/80

## 2020-02-27 DIAGNOSIS — H35033 Hypertensive retinopathy, bilateral: Secondary | ICD-10-CM

## 2020-02-27 DIAGNOSIS — H43812 Vitreous degeneration, left eye: Secondary | ICD-10-CM

## 2020-02-27 DIAGNOSIS — Z961 Presence of intraocular lens: Secondary | ICD-10-CM

## 2020-02-27 DIAGNOSIS — H3581 Retinal edema: Secondary | ICD-10-CM | POA: Diagnosis not present

## 2020-02-27 DIAGNOSIS — H4312 Vitreous hemorrhage, left eye: Secondary | ICD-10-CM

## 2020-02-27 DIAGNOSIS — I10 Essential (primary) hypertension: Secondary | ICD-10-CM

## 2020-02-27 NOTE — Progress Notes (Addendum)
[facility] Note  02/27/2020     CHIEF COMPLAINT Patient presents for Retina Follow Up   HISTORY OF PRESENT ILLNESS: Gary Morales is a 69 y.o. male who presents to the clinic today for:   HPI    Retina Follow Up    Patient presents with  PVD.  In left eye.  This started 2 months ago.  Severity is moderate.  I, the attending physician,  performed the HPI with the patient and updated documentation appropriately.          Comments    Patient here for 2 month (6 days) retina follow up for  Hemorrhagic PVD OS. Patient states 6 days ago OS had an attack. Had huge floating cloud. [place] web[place], was just driving. Wasn't doing anything . Vision is fuzzy OS. OD really clear. Has like a film over entire eye. No eye pain.       Last edited by Bernarda Caffey, MD on 03/01/2020  9:31 PM. (History)    pt states after his last visit here, his eye cleared up and his vision was good, he states last Thursday he was riding in the car and his left eye "went to pieces", he states it looks like he is looking through cellophane, he denies seeing flashing lights, he states he has been on medication for blood pressure for about 20 years  Referring physician: Curlene Labrum, MD [address],  [place] [address]  HISTORICAL INFORMATION:   Selected notes from the [facility]: Current Outpatient Medications (Ophthalmic Drugs)  Medication Sig  . tetrahydrozoline 0.05 % ophthalmic solution Place into both eyes as needed.   No current facility-administered medications for this visit. (Ophthalmic Drugs)   Current Outpatient Medications (Other)  Medication Sig  . amLODipine-valsartan (EXFORGE) 5-320 MG tablet Take by mouth as directed.  Marland Kitchen aspirin 81 MG EC tablet Take 81 mg by mouth daily.  Marland Kitchen doxycycline (VIBRA-TABS) 100 MG tablet Take 100 mg by mouth daily.  . hydrochlorothiazide (HYDRODIURIL) 25 MG tablet Take 25 mg by mouth daily.  Marland Kitchen losartan  (COZAAR) 100 MG tablet Take 100 mg by mouth daily.  . metroNIDAZOLE (METROGEL) 1 % gel as directed.  . Multiple Vitamin (MULTI-VITAMIN) tablet Take by mouth daily.  . predniSONE (STERAPRED UNI-PAK 21 TAB) 10 MG (21) TBPK tablet Take 10 mg by mouth daily.  . rosuvastatin (CRESTOR) 10 MG tablet Take 10 mg by mouth at bedtime.   No current facility-administered medications for this visit. (Other)      REVIEW OF SYSTEMS: ROS    Positive for: Skin, Eyes   Negative for: Constitutional, Gastrointestinal, Neurological, Genitourinary, Musculoskeletal, HENT, Endocrine, Cardiovascular, Respiratory, Psychiatric, Allergic/Imm, Heme/Lymph   Last edited by Theodore Demark, COA on 02/27/2020  1:37 PM. (History)       ALLERGIES No Known Allergies  PAST MEDICAL HISTORY Past Medical History:  Diagnosis Date  . Hypertensive retinopathy    Past Surgical History:  Procedure Laterality Date  . CATARACT EXTRACTION Bilateral 2017   hecker, toric implants OU    FAMILY HISTORY Family History  Problem Relation Age of Onset  . Diabetes Father     SOCIAL HISTORY Social History   Tobacco Use  . Smoking status: Former Research scientist (life sciences)  . Smokeless tobacco: Former Network engineer Use Topics  . Alcohol use: Yes    Alcohol/week: 2.0 standard drinks    Types: 2 Cans of beer per week  Comment: daily  . Drug use: Not on file         OPHTHALMIC EXAM:  Base Eye Exam    Visual Acuity (Snellen - Linear)      Right Left   Dist [place] 20/30 -1 20/60   Dist ph [place] NI 20/40 -1       Tonometry (Tonopen, 1:32 PM)      Right Left   Pressure 17 14       Pupils      Dark Light Shape React APD   Right 4 3 Round Slow None   Left 4 3 Round Slow None       Visual Fields (Counting fingers)      Left Right    Full Full       Extraocular Movement      Right Left    Full, Ortho Full, Ortho       Neuro/Psych    Oriented x3: Yes   Mood/Affect: Normal       Dilation    Both eyes: 1.0% Mydriacyl,  2.5% Phenylephrine @ 1:32 PM        Slit Lamp and Fundus Exam    Slit Lamp Exam      Right Left   Lids/Lashes Dermatochalasis - upper lid Dermatochalasis - upper lid   Conjunctiva/Sclera White and quiet Trace Injection   Cornea Mild Arcus, mild EBMD, well healed temporal cataract wounds Mild Arcus, mild EBMD, well healed superior cataract wounds   Anterior Chamber Deep and quiet Deep and quiet   Iris Round and dilated Round and dilated   Lens PC IOL in good position, 1+ non-central Posterior capsular opacification PC IOL in good position, trace Posterior capsular opacification   Vitreous Vitreous syneresis Vitreous syneresis, +RBC ant vit, Posterior vitreous detachment, Weiss ring, blood stained vitreous condensations centrally, partially obscuring macula and disc       Fundus Exam      Right Left   Disc Pink and Sharp Pink and Sharp   C/D Ratio 0.6 0.65   Macula Flat, blunted foveal reflex, Retinal pigment epithelial mottling, No heme or edema Flat, blunted foveal reflex, Retinal pigment epithelial mottling, drusen, No heme or edema   Vessels Mild Vascular attenuation, Tortuousity Mild Vascular attenuation, Tortuousity, mild AV crossing changes   Periphery Attached    Attached, no RT/RD          IMAGING AND PROCEDURES  Imaging and Procedures for @TODAY @  OCT, Retina - OU - Both Eyes       Right Eye Quality was good. Central Foveal Thickness: 264. Progression has been stable. Findings include normal foveal contour, no IRF, no SRF.   Left Eye Quality was good. Central Foveal Thickness: 263. Progression has been stable. Findings include normal foveal contour, no IRF, no SRF, retinal drusen  (Rare drusen, interval increase in vitreous opacities).   Notes *Images captured and stored on drive  Diagnosis / Impression:  NFP, no IRF/SRF OU OS: Rare drusen, interval increase in vitreous opacities  Clinical management:  See below  Abbreviations: NFP - Normal foveal profile.  CME - cystoid macular edema. PED - pigment epithelial detachment. IRF - intraretinal fluid. SRF - subretinal fluid. EZ - ellipsoid zone. ERM - epiretinal membrane. ORA - outer retinal atrophy. ORT - outer retinal tubulation. SRHM - subretinal hyper-reflective material        Fluorescein Angiography Optos (Transit OS)       Right Eye   Progression has no prior data. Early  phase findings include normal observations. Mid/Late phase findings include normal observations.   Left Eye   Progression has no prior data. Early phase findings include blockage, staining. Mid/Late phase findings include blockage, staining (No significant leakage).   Notes **Images stored on drive**  Impression: OD: normal study OS: no significant leakage                  ASSESSMENT/PLAN:    ICD-10-CM   1. Posterior vitreous detachment of left eye  H43.812   2. Vitreous hemorrhage of left eye (HCC)  H43.12   3. Retinal edema  H35.81 OCT, Retina - OU - Both Eyes  4. Essential hypertension  I10   5. Hypertensive retinopathy of both eyes  H35.033 Fluorescein Angiography Optos (Transit OS)  6. Pseudophakia of both eyes  Z96.1     1,2. Hemorrhagic PVD OS  - original onset ~1.10.21 -- was seen 1.15.21, then lost to f/u  - pt reports, floaters initially improved but then symptoms worsened / recurred on Thurs 3.11.21  - exam with increased vitreous floaters/condensations  - FA 3.17.21 without significant leakage to suggest another etiology  - Discussed findings and prognosis  - No RT or RD on 360 peripheral exam  - Reviewed s/s of RT/RD  - Strict return precautions for any such RT/RD signs/symptoms  - VH precautions reviewed -- minimize activities, keep head elevated, avoid ASA/NSAIDs/blood thinners as able  - F/U 1 week -- DFE/OCT  3. No retinal edema on exam or OCT  4,5. Hypertensive retinopathy OU  - discussed importance of tight BP control  - monitor  6. Pseudophakia OU  - s/p CE/IOL OU  (Dr. Herbert Deaner, 2017)  - doing well  - monitor   Ophthalmic Meds Ordered this visit:  No orders of the defined types were placed in this encounter.      Return in about 1 week (around 03/05/2020) for f/u hemorrhagic PVD OS, DFE, OCT.  There are no Patient Instructions on file for this visit.   Explained the diagnoses, plan, and follow up with the patient and they expressed understanding.  Patient expressed understanding of the importance of proper follow up care.   This document serves as a record of services personally performed by Gardiner Sleeper, MD, PhD. It was created on their behalf by Ernest Mallick, OA, an ophthalmic assistant. The creation of this record is the provider's dictation and/or activities during the visit.    Electronically signed by: Ernest Mallick, OA 03.17.2021 9:40 PM   Gardiner Sleeper, M.D., Ph.D. Diseases & Surgery of the Retina and Vitreous [facility]  I have reviewed the above documentation for accuracy and completeness, and I agree with the above. Gardiner Sleeper, M.D., Ph.D. 03/01/20 9:40 PM   Abbreviations: M myopia (nearsighted); A astigmatism; H hyperopia (farsighted); P presbyopia; Mrx spectacle prescription;  CTL contact lenses; OD right eye; OS left eye; OU both eyes  XT exotropia; ET esotropia; PEK punctate epithelial keratitis; PEE punctate epithelial erosions; DES dry eye syndrome; MGD meibomian gland dysfunction; ATs artificial tears; PFAT's preservative free artificial tears; [place] nuclear sclerotic cataract; PSC posterior subcapsular cataract; ERM epi-retinal membrane; PVD posterior vitreous detachment; RD retinal detachment; DM diabetes mellitus; DR diabetic retinopathy; NPDR non-proliferative diabetic retinopathy; PDR proliferative diabetic retinopathy; CSME clinically significant macular edema; DME diabetic macular edema; dbh dot blot hemorrhages; CWS cotton wool spot; POAG primary open angle glaucoma; C/D cup-to-disc ratio;  HVF humphrey visual field; GVF goldmann visual field; OCT  optical coherence tomography; IOP intraocular pressure; BRVO Branch retinal vein occlusion; CRVO central retinal vein occlusion; CRAO central retinal artery occlusion; BRAO branch retinal artery occlusion; RT retinal tear; SB scleral buckle; PPV pars plana vitrectomy; VH Vitreous hemorrhage; PRP panretinal laser photocoagulation; IVK intravitreal kenalog; VMT vitreomacular traction; MH Macular hole;  NVD neovascularization of the disc; NVE neovascularization elsewhere; AREDS age related eye disease study; ARMD age related macular degeneration; POAG primary open angle glaucoma; EBMD epithelial/anterior basement membrane dystrophy; ACIOL anterior chamber intraocular lens; IOL intraocular lens; PCIOL posterior chamber intraocular lens; Phaco/IOL phacoemulsification with intraocular lens placement; [place] photorefractive keratectomy; LASIK laser assisted in situ keratomileusis; HTN hypertension; DM diabetes mellitus; COPD chronic obstructive pulmonary disease

## 2020-02-28 ENCOUNTER — Encounter (INDEPENDENT_AMBULATORY_CARE_PROVIDER_SITE_OTHER): Payer: Managed Care, Other (non HMO) | Admitting: Ophthalmology

## 2020-02-28 NOTE — Progress Notes (Signed)
[facility] Note  03/05/2020     CHIEF COMPLAINT Patient presents for Retina Follow Up   HISTORY OF PRESENT ILLNESS: Gary Morales is a 69 y.o. male who presents to the clinic today for:   HPI    Retina Follow Up    Patient presents with  Other.  In left eye.  Severity is mild.  Duration of 6 days.  Since onset it is gradually improving.  I, the attending physician,  performed the HPI with the patient and updated documentation appropriately.          Comments    Pt states his vision is better, but he still sees a thin layer of blood in his eye, he has been sleeping sitting up       Last edited by Bernarda Caffey, MD on 03/05/2020  8:06 AM. (History)    pt states he is doing much better, he states he still sees one large floater, he has been sleeping with his head elevated, pt takes a baby aspirin every day, but he has never had a heart attack or stroke  Referring physician: Curlene Labrum, MD [place],  [place] [address]  HISTORICAL INFORMATION:   Selected notes from the [facility]: Current Outpatient Medications (Ophthalmic Drugs)  Medication Sig  . tetrahydrozoline 0.05 % ophthalmic solution Place into both eyes as needed.   No current facility-administered medications for this visit. (Ophthalmic Drugs)   Current Outpatient Medications (Other)  Medication Sig  . amLODipine-valsartan (EXFORGE) 5-320 MG tablet Take by mouth as directed.  Marland Kitchen aspirin 81 MG EC tablet Take 81 mg by mouth daily.  Marland Kitchen doxycycline (VIBRA-TABS) 100 MG tablet Take 100 mg by mouth daily.  . hydrochlorothiazide (HYDRODIURIL) 25 MG tablet Take 25 mg by mouth daily.  Marland Kitchen losartan (COZAAR) 100 MG tablet Take 100 mg by mouth daily.  . metroNIDAZOLE (METROGEL) 1 % gel as directed.  . Multiple Vitamin (MULTI-VITAMIN) tablet Take by mouth daily.  . predniSONE (STERAPRED UNI-PAK 21 TAB) 10 MG (21) TBPK tablet Take 10 mg by mouth daily.  .  rosuvastatin (CRESTOR) 10 MG tablet Take 10 mg by mouth at bedtime.   No current facility-administered medications for this visit. (Other)      REVIEW OF SYSTEMS: ROS    Positive for: Cardiovascular, Eyes   Negative for: Constitutional, Gastrointestinal, Neurological, Skin, Genitourinary, Musculoskeletal, HENT, Endocrine, Respiratory, Psychiatric, Allergic/Imm, Heme/Lymph   Last edited by Debbrah Alar, COT on 03/05/2020  8:04 AM. (History)       ALLERGIES No Known Allergies  PAST MEDICAL HISTORY Past Medical History:  Diagnosis Date  . Hypertensive retinopathy    Past Surgical History:  Procedure Laterality Date  . CATARACT EXTRACTION Bilateral 2017   hecker, toric implants OU    FAMILY HISTORY Family History  Problem Relation Age of Onset  . Diabetes Father     SOCIAL HISTORY Social History   Tobacco Use  . Smoking status: Former Research scientist (life sciences)  . Smokeless tobacco: Former Network engineer Use Topics  . Alcohol use: Yes    Alcohol/week: 2.0 standard drinks    Types: 2 Cans of beer per week    Comment: daily  . Drug use: Not on file         OPHTHALMIC EXAM:  Base Eye Exam    Visual Acuity (Snellen - Linear)      Right Left   Dist cc 20/30 -  2 20/30 +2   Dist ph cc 20/30 +1 20/20 -2   Correction: Glasses       Tonometry (Tonopen, 7:53 AM)      Right Left   Pressure 14 14       Pupils      Dark Light Shape React APD   Right 3 2 Round Brisk None   Left 3 2 Round Brisk None       Neuro/Psych    Oriented x3: Yes   Mood/Affect: Normal       Dilation    Both eyes: 1.0% Mydriacyl, 2.5% Phenylephrine @ 7:53 AM        Slit Lamp and Fundus Exam    Slit Lamp Exam      Right Left   Lids/Lashes Dermatochalasis - upper lid Dermatochalasis - upper lid   Conjunctiva/Sclera White and quiet Trace Injection   Cornea Mild Arcus, mild EBMD, well healed temporal cataract wounds Mild Arcus, mild EBMD, well healed superior cataract wounds   Anterior Chamber  Deep and quiet Deep and quiet   Iris Round and dilated Round and dilated   Lens PC IOL in good position, 1+ non-central Posterior capsular opacification PC IOL in good position, trace Posterior capsular opacification   Vitreous Vitreous syneresis Vitreous syneresis, +RBC ant vit, Posterior vitreous detachment, blood stained vitreous condensations centrally improving and settling inferiorly, white blood clots settled inferiorly       Fundus Exam      Right Left   Disc Pink and Sharp Pink and Sharp   C/D Ratio 0.6 0.65   Macula Flat, blunted foveal reflex, Retinal pigment epithelial mottling, No heme or edema Improved view, Flat, blunted foveal reflex, Retinal pigment epithelial mottling, drusen, No heme or edema   Vessels Mild Vascular attenuation, Tortuousity Mild Vascular attenuation, Tortuousity, mild AV crossing changes   Periphery Attached    Attached, no RT/RD          IMAGING AND PROCEDURES  Imaging and Procedures for @TODAY @  OCT, Retina - OU - Both Eyes       Right Eye Quality was good. Central Foveal Thickness: 260. Progression has been stable. Findings include normal foveal contour, no IRF, no SRF.   Left Eye Quality was good. Central Foveal Thickness: 260. Progression has been stable. Findings include normal foveal contour, no IRF, no SRF, retinal drusen  (Rare drusen, interval improvement in vitreous opacities).   Notes *Images captured and stored on drive  Diagnosis / Impression:  NFP, no IRF/SRF OU OS: Rare drusen, interval improvement in vitreous opacities  Clinical management:  See below  Abbreviations: NFP - Normal foveal profile. CME - cystoid macular edema. PED - pigment epithelial detachment. IRF - intraretinal fluid. SRF - subretinal fluid. EZ - ellipsoid zone. ERM - epiretinal membrane. ORA - outer retinal atrophy. ORT - outer retinal tubulation. SRHM - subretinal hyper-reflective material                 ASSESSMENT/PLAN:    ICD-10-CM   1.  Posterior vitreous detachment of left eye  H43.812   2. Vitreous hemorrhage of left eye (HCC)  H43.12   3. Retinal edema  H35.81 OCT, Retina - OU - Both Eyes  4. Essential hypertension  I10   5. Hypertensive retinopathy of both eyes  H35.033   6. Pseudophakia of both eyes  Z96.1     1,2. Hemorrhagic PVD OS             - original onset ~  1.10.21 -- was seen 1.15.21, then lost to f/u             - VH recurred on Thurs 3.11.21 -- presented here on 3.17.21             - FA 3.17.21 without significant leakage to suggest another etiology             - today, VH clearing and settling inferiorly -- improved with position and VH precautions             - Discussed findings and prognosis             - No RT or RD on 360 peripheral exam             - Reviewed s/s of RT/RD             - Strict return precautions for any such RT/RD signs/symptoms             - VH precautions reviewed -- minimize activities, keep head elevated, avoid ASA/NSAIDs/blood thinners   - F/U 2 weeks -- DFE/OCT  3. No retinal edema on exam or OCT  4,5. Hypertensive retinopathy OU  - discussed importance of tight BP control  - monitor  6. Pseudophakia OU  - s/p CE/IOL OU (Dr. Herbert Deaner, 2017)  - doing well  - monitor   Ophthalmic Meds Ordered this visit:  No orders of the defined types were placed in this encounter.      Return in about 2 weeks (around 03/19/2020) for f/u hemorrhagic PVD OS, DFE, OCT.  There are no Patient Instructions on file for this visit.   Explained the diagnoses, plan, and follow up with the patient and they expressed understanding.  Patient expressed understanding of the importance of proper follow up care.   This document serves as a record of services personally performed by Gardiner Sleeper, MD, PhD. It was created on their behalf by Ernest Mallick, OA, an ophthalmic assistant. The creation of this record is the provider's dictation and/or activities during the visit.    Electronically signed  by: Ernest Mallick, OA 03.24.2021 8:47 AM  Gardiner Sleeper, M.D., Ph.D. Diseases & Surgery of the Retina and [place] 03/05/2020   I have reviewed the above documentation for accuracy and completeness, and I agree with the above. Gardiner Sleeper, M.D., Ph.D. 03/05/20 8:52 AM   Abbreviations: M myopia (nearsighted); A astigmatism; H hyperopia (farsighted); P presbyopia; Mrx spectacle prescription;  CTL contact lenses; OD right eye; OS left eye; OU both eyes  XT exotropia; ET esotropia; PEK punctate epithelial keratitis; PEE punctate epithelial erosions; DES dry eye syndrome; MGD meibomian gland dysfunction; ATs artificial tears; PFAT's preservative free artificial tears; [place] nuclear sclerotic cataract; PSC posterior subcapsular cataract; ERM epi-retinal membrane; PVD posterior vitreous detachment; RD retinal detachment; DM diabetes mellitus; DR diabetic retinopathy; NPDR non-proliferative diabetic retinopathy; PDR proliferative diabetic retinopathy; CSME clinically significant macular edema; DME diabetic macular edema; dbh dot blot hemorrhages; CWS cotton wool spot; POAG primary open angle glaucoma; C/D cup-to-disc ratio; HVF humphrey visual field; GVF goldmann visual field; OCT optical coherence tomography; IOP intraocular pressure; BRVO Branch retinal vein occlusion; CRVO central retinal vein occlusion; CRAO central retinal artery occlusion; BRAO branch retinal artery occlusion; RT retinal tear; SB scleral buckle; PPV pars plana vitrectomy; VH Vitreous hemorrhage; PRP panretinal laser photocoagulation; IVK intravitreal kenalog; VMT vitreomacular traction; MH Macular hole;  NVD neovascularization of the disc; NVE neovascularization elsewhere;  AREDS age related eye disease study; ARMD age related macular degeneration; POAG primary open angle glaucoma; EBMD epithelial/anterior basement membrane dystrophy; ACIOL anterior chamber intraocular lens; IOL intraocular lens; PCIOL  posterior chamber intraocular lens; Phaco/IOL phacoemulsification with intraocular lens placement; [place] photorefractive keratectomy; LASIK laser assisted in situ keratomileusis; HTN hypertension; DM diabetes mellitus; COPD chronic obstructive pulmonary disease

## 2020-03-01 ENCOUNTER — Encounter (INDEPENDENT_AMBULATORY_CARE_PROVIDER_SITE_OTHER): Payer: Self-pay | Admitting: Ophthalmology

## 2020-03-05 ENCOUNTER — Encounter (INDEPENDENT_AMBULATORY_CARE_PROVIDER_SITE_OTHER): Payer: Self-pay | Admitting: Ophthalmology

## 2020-03-05 ENCOUNTER — Ambulatory Visit (INDEPENDENT_AMBULATORY_CARE_PROVIDER_SITE_OTHER): Payer: 59 | Admitting: Ophthalmology

## 2020-03-05 DIAGNOSIS — H43812 Vitreous degeneration, left eye: Secondary | ICD-10-CM

## 2020-03-05 DIAGNOSIS — I10 Essential (primary) hypertension: Secondary | ICD-10-CM | POA: Diagnosis not present

## 2020-03-05 DIAGNOSIS — H35033 Hypertensive retinopathy, bilateral: Secondary | ICD-10-CM

## 2020-03-05 DIAGNOSIS — H3581 Retinal edema: Secondary | ICD-10-CM

## 2020-03-05 DIAGNOSIS — H4312 Vitreous hemorrhage, left eye: Secondary | ICD-10-CM | POA: Diagnosis not present

## 2020-03-05 DIAGNOSIS — Z961 Presence of intraocular lens: Secondary | ICD-10-CM

## 2020-03-17 NOTE — Progress Notes (Addendum)
[facility] Note  03/19/2020     CHIEF COMPLAINT Patient presents for Retina Follow Up   HISTORY OF PRESENT ILLNESS: Gary Morales is a 69 y.o. male who presents to the clinic today for:   HPI    Retina Follow Up    Patient presents with  Other.  In left eye.  This started 2 weeks ago.  Severity is moderate.  I, the attending physician,  performed the HPI with the patient and updated documentation appropriately.          Comments    Patient here for 2 weeks retina follow up for PVD with heme OS. Patient states vision doing much better. No eye pain.        Last edited by Bernarda Caffey, MD on 03/19/2020  8:48 AM. (History)    pt states vision continues to improve. Less floaters. No new flashes. Pt reports stopping baby aspirin after this second hemorrhage and states bruising has also improved.   Referring physician: Curlene Labrum, MD [place],  [place] [address]  HISTORICAL INFORMATION:   Selected notes from the MEDICAL RECORD NUMBER    CURRENT MEDICATIONS: Current Outpatient Medications (Ophthalmic Drugs)  Medication Sig  . tetrahydrozoline 0.05 % ophthalmic solution Place into both eyes as needed.   No current facility-administered medications for this visit. (Ophthalmic Drugs)   Current Outpatient Medications (Other)  Medication Sig  . amLODipine-valsartan (EXFORGE) 5-320 MG tablet Take by mouth as directed.  Marland Kitchen aspirin 81 MG EC tablet Take 81 mg by mouth daily.  Marland Kitchen doxycycline (VIBRA-TABS) 100 MG tablet Take 100 mg by mouth daily.  . hydrochlorothiazide (HYDRODIURIL) 25 MG tablet Take 25 mg by mouth daily.  Marland Kitchen losartan (COZAAR) 100 MG tablet Take 100 mg by mouth daily.  . metroNIDAZOLE (METROGEL) 1 % gel as directed.  . Multiple Vitamin (MULTI-VITAMIN) tablet Take by mouth daily.  . predniSONE (STERAPRED UNI-PAK 21 TAB) 10 MG (21) TBPK tablet Take 10 mg by mouth daily.  . rosuvastatin (CRESTOR) 10 MG tablet Take 10 mg by mouth at  bedtime.   No current facility-administered medications for this visit. (Other)      REVIEW OF SYSTEMS: ROS    Positive for: Skin, Cardiovascular, Eyes   Negative for: Constitutional, Gastrointestinal, Neurological, Genitourinary, Musculoskeletal, HENT, Endocrine, Respiratory, Psychiatric, Allergic/Imm, Heme/Lymph   Last edited by Theodore Demark, COA on 03/19/2020  7:48 AM. (History)       ALLERGIES No Known Allergies  PAST MEDICAL HISTORY Past Medical History:  Diagnosis Date  . Hypertensive retinopathy    Past Surgical History:  Procedure Laterality Date  . CATARACT EXTRACTION Bilateral 2017   hecker, toric implants OU    FAMILY HISTORY Family History  Problem Relation Age of Onset  . Diabetes Father     SOCIAL HISTORY Social History   Tobacco Use  . Smoking status: Former Research scientist (life sciences)  . Smokeless tobacco: Former Network engineer Use Topics  . Alcohol use: Yes    Alcohol/week: 2.0 standard drinks    Types: 2 Cans of beer per week    Comment: daily  . Drug use: Not on file         OPHTHALMIC EXAM:  Base Eye Exam    Visual Acuity (Snellen - Linear)      Right Left   Dist [place] 20/30 -2 20/20 -2   Dist ph [place] 20/25 -2        Tonometry (Tonopen, 7:45  AM)      Right Left   Pressure 12 11       Pupils      Dark Light Shape React APD   Right 3 2 Round Brisk None   Left 3 2 Round Brisk None       Visual Fields (Counting fingers)      Left Right    Full Full       Extraocular Movement      Right Left    Full, Ortho Full, Ortho       Neuro/Psych    Oriented x3: Yes   Mood/Affect: Normal       Dilation    Both eyes: 1.0% Mydriacyl, 2.5% Phenylephrine @ 7:45 AM        Slit Lamp and Fundus Exam    Slit Lamp Exam      Right Left   Lids/Lashes Dermatochalasis - upper lid Dermatochalasis - upper lid   Conjunctiva/Sclera White and quiet Trace Injection   Cornea Mild Arcus, mild EBMD, well healed temporal cataract wounds Mild Arcus, mild EBMD,  well healed superior cataract wounds   Anterior Chamber Deep and quiet Deep and quiet   Iris Round and dilated Round and dilated   Lens PC IOL in good position, 1+ non-central Posterior capsular opacification PC IOL in good position, trace Posterior capsular opacification   Vitreous Vitreous syneresis Vitreous syneresis, Posterior vitreous detachment, blood stained vitreous condensations clearing centrally and settling inferiorly, white blood clots settled inferiorly improving; no red heme       Fundus Exam      Right Left   Disc Pink and Sharp Pink and Sharp   C/D Ratio 0.6 0.65   Macula Flat, blunted foveal reflex, Retinal pigment epithelial mottling, No heme or edema Improved view, Flat, blunted foveal reflex, Retinal pigment epithelial mottling, drusen, No heme or edema   Vessels Mild Vascular attenuation, Tortuousity Mild Vascular attenuation, Tortuousity, mild AV crossing changes   Periphery Attached    Attached, no RT/RD          IMAGING AND PROCEDURES  Imaging and Procedures for @TODAY @  OCT, Retina - OU - Both Eyes       Right Eye Quality was good. Central Foveal Thickness: 262. Progression has been stable. Findings include normal foveal contour, no IRF, no SRF.   Left Eye Quality was good. Central Foveal Thickness: 260. Progression has improved. Findings include normal foveal contour, no IRF, no SRF, retinal drusen  (Rare drusen, interval improvement in vitreous opacities).   Notes *Images captured and stored on drive  Diagnosis / Impression:  NFP, no IRF/SRF OU OS: Rare drusen, interval improvement in vitreous opacities  Clinical management:  See below  Abbreviations: NFP - Normal foveal profile. CME - cystoid macular edema. PED - pigment epithelial detachment. IRF - intraretinal fluid. SRF - subretinal fluid. EZ - ellipsoid zone. ERM - epiretinal membrane. ORA - outer retinal atrophy. ORT - outer retinal tubulation. SRHM - subretinal hyper-reflective material                  ASSESSMENT/PLAN:    ICD-10-CM   1. Posterior vitreous detachment of left eye  H43.812   2. Vitreous hemorrhage of left eye (HCC)  H43.12   3. Retinal edema  H35.81 OCT, Retina - OU - Both Eyes  4. Essential hypertension  I10   5. Hypertensive retinopathy of both eyes  H35.033   6. Pseudophakia of both eyes  Z96.1  1,2. Hemorrhagic PVD OS             - original onset ~1.10.21 -- was seen 1.15.21, then lost to f/u             - VH recurred on Thurs 3.11.21 -- presented here on 3.17.21             - FA 3.17.21 without significant leakage to suggest another etiology  - pt reports history of bruising on ASA 81 -- stopped after 02/27/20 visit here             - today, VH clearing and settling inferiorly -- improved with positionVH precautions, and ASA stoppage             - Discussed findings and prognosis             - No RT or RD on 360 peripheral exam             - Reviewed s/s of RT/RD             - Strict return precautions for any such RT/RD signs/symptoms             - VH precautions reviewed -- minimize activities, keep head elevated, avoid ASA/NSAIDs/blood thinners   - F/U 4-6 weeks -- DFE/OCT  3. No retinal edema on exam or OCT  4,5. Hypertensive retinopathy OU  - discussed importance of tight BP control  - monitor  6. Pseudophakia OU  - s/p CE/IOL OU (Dr. Herbert Deaner, 2017)  - doing well  - monitor   Ophthalmic Meds Ordered this visit:  No orders of the defined types were placed in this encounter.      Return for 4-6 wks -- f/u VH OS Dilated Exam, OCT.  There are no Patient Instructions on file for this visit.   Explained the diagnoses, plan, and follow up with the patient and they expressed understanding.  Patient expressed understanding of the importance of proper follow up care.   This document serves as a record of services personally performed by Gardiner Sleeper, MD, PhD. It was created on their behalf by Roselee Nova, COMT. The creation  of this record is the provider's dictation and/or activities during the visit.  Electronically signed by: Roselee Nova, COMT 03/19/20 8:50 AM   This document serves as a record of services personally performed by Gardiner Sleeper, MD, PhD. It was created on their behalf by Ernest Mallick, OA, an ophthalmic assistant. The creation of this record is the provider's dictation and/or activities during the visit.    Electronically signed by: Ernest Mallick, OA 04.07.2021 8:50 AM  Gardiner Sleeper, M.D., Ph.D. Diseases & Surgery of the Retina and [place] 03/19/2020   I have reviewed the above documentation for accuracy and completeness, and I agree with the above. Gardiner Sleeper, M.D., Ph.D. 03/19/20 8:50 AM   Abbreviations: M myopia (nearsighted); A astigmatism; H hyperopia (farsighted); P presbyopia; Mrx spectacle prescription;  CTL contact lenses; OD right eye; OS left eye; OU both eyes  XT exotropia; ET esotropia; PEK punctate epithelial keratitis; PEE punctate epithelial erosions; DES dry eye syndrome; MGD meibomian gland dysfunction; ATs artificial tears; PFAT's preservative free artificial tears; [place] nuclear sclerotic cataract; PSC posterior subcapsular cataract; ERM epi-retinal membrane; PVD posterior vitreous detachment; RD retinal detachment; DM diabetes mellitus; DR diabetic retinopathy; NPDR non-proliferative diabetic retinopathy; PDR proliferative diabetic retinopathy; CSME clinically significant macular edema; DME diabetic macular edema; dbh  dot blot hemorrhages; CWS cotton wool spot; POAG primary open angle glaucoma; C/D cup-to-disc ratio; HVF humphrey visual field; GVF goldmann visual field; OCT optical coherence tomography; IOP intraocular pressure; BRVO Branch retinal vein occlusion; CRVO central retinal vein occlusion; CRAO central retinal artery occlusion; BRAO branch retinal artery occlusion; RT retinal tear; SB scleral buckle; PPV pars plana vitrectomy;  VH Vitreous hemorrhage; PRP panretinal laser photocoagulation; IVK intravitreal kenalog; VMT vitreomacular traction; MH Macular hole;  NVD neovascularization of the disc; NVE neovascularization elsewhere; AREDS age related eye disease study; ARMD age related macular degeneration; POAG primary open angle glaucoma; EBMD epithelial/anterior basement membrane dystrophy; ACIOL anterior chamber intraocular lens; IOL intraocular lens; PCIOL posterior chamber intraocular lens; Phaco/IOL phacoemulsification with intraocular lens placement; [place] photorefractive keratectomy; LASIK laser assisted in situ keratomileusis; HTN hypertension; DM diabetes mellitus; COPD chronic obstructive pulmonary disease

## 2020-03-19 ENCOUNTER — Other Ambulatory Visit: Payer: Self-pay

## 2020-03-19 ENCOUNTER — Encounter (INDEPENDENT_AMBULATORY_CARE_PROVIDER_SITE_OTHER): Payer: Self-pay | Admitting: Ophthalmology

## 2020-03-19 ENCOUNTER — Ambulatory Visit (INDEPENDENT_AMBULATORY_CARE_PROVIDER_SITE_OTHER): Payer: 59 | Admitting: Ophthalmology

## 2020-03-19 DIAGNOSIS — H4312 Vitreous hemorrhage, left eye: Secondary | ICD-10-CM

## 2020-03-19 DIAGNOSIS — H43812 Vitreous degeneration, left eye: Secondary | ICD-10-CM | POA: Diagnosis not present

## 2020-03-19 DIAGNOSIS — I10 Essential (primary) hypertension: Secondary | ICD-10-CM

## 2020-03-19 DIAGNOSIS — Z961 Presence of intraocular lens: Secondary | ICD-10-CM

## 2020-03-19 DIAGNOSIS — H3581 Retinal edema: Secondary | ICD-10-CM

## 2020-03-19 DIAGNOSIS — H35033 Hypertensive retinopathy, bilateral: Secondary | ICD-10-CM

## 2020-04-29 NOTE — Progress Notes (Signed)
[facility] Note  05/07/2020     CHIEF COMPLAINT Patient presents for Retina Follow Up   HISTORY OF PRESENT ILLNESS: Gary Morales is a 69 y.o. male who presents to the clinic today for:   HPI    Retina Follow Up    Patient presents with  Other.  In left eye.  This started months ago.  Severity is moderate.  Duration of 6 weeks.  Since onset it is stable.  I, the attending physician,  performed the HPI with the patient and updated documentation appropriately.          Comments    69 y/o male pt here for 6 wk f/u for hemorrhagic PVD OS.  VA OS feels slightly improved.  No change in [place] OD.  Denies pain, FOL,  Has a few floaters OU.  AT prn OU.       Last edited by Bernarda Caffey, MD on 05/07/2020  8:10 AM. (History)    pt states he thinks his left eye is "doing great"   Referring physician: Curlene Labrum, MD [place],  [place] [address]  HISTORICAL INFORMATION:   Selected notes from the MEDICAL RECORD NUMBER    CURRENT MEDICATIONS: Current Outpatient Medications (Ophthalmic Drugs)  Medication Sig  . tetrahydrozoline 0.05 % ophthalmic solution Place into both eyes as needed.   No current facility-administered medications for this visit. (Ophthalmic Drugs)   Current Outpatient Medications (Other)  Medication Sig  . amLODipine-valsartan (EXFORGE) 5-320 MG tablet Take by mouth as directed.  Marland Kitchen aspirin 81 MG EC tablet Take 81 mg by mouth daily.  Marland Kitchen doxycycline (VIBRA-TABS) 100 MG tablet Take 100 mg by mouth daily.  . hydrochlorothiazide (HYDRODIURIL) 25 MG tablet Take 25 mg by mouth daily.  Marland Kitchen losartan (COZAAR) 100 MG tablet Take 100 mg by mouth daily.  . metroNIDAZOLE (METROGEL) 1 % gel as directed.  . Multiple Vitamin (MULTI-VITAMIN) tablet Take by mouth daily.  . predniSONE (STERAPRED UNI-PAK 21 TAB) 10 MG (21) TBPK tablet Take 10 mg by mouth daily.  . rosuvastatin (CRESTOR) 10 MG tablet Take 10 mg by mouth at bedtime.   No current  facility-administered medications for this visit. (Other)      REVIEW OF SYSTEMS: ROS    Positive for: Eyes   Negative for: Constitutional, Gastrointestinal, Neurological, Skin, Genitourinary, Musculoskeletal, HENT, Endocrine, Cardiovascular, Respiratory, Psychiatric, Allergic/Imm, Heme/Lymph   Last edited by Matthew Folks, COA on 05/07/2020  8:04 AM. (History)       ALLERGIES No Known Allergies  PAST MEDICAL HISTORY Past Medical History:  Diagnosis Date  . Hypertensive retinopathy    Past Surgical History:  Procedure Laterality Date  . CATARACT EXTRACTION Bilateral 2017   hecker, toric implants OU    FAMILY HISTORY Family History  Problem Relation Age of Onset  . Diabetes Father     SOCIAL HISTORY Social History   Tobacco Use  . Smoking status: Former Research scientist (life sciences)  . Smokeless tobacco: Former Network engineer Use Topics  . Alcohol use: Yes    Alcohol/week: 2.0 standard drinks    Types: 2 Cans of beer per week    Comment: daily  . Drug use: Not on file         OPHTHALMIC EXAM:  Base Eye Exam    Visual Acuity (Snellen - Linear)      Right Left   Dist [place] 20/25 -2 20/25 -2   Dist ph  NI NI  Tonometry (Tonopen, 8:06 AM)      Right Left   Pressure 12 11       Pupils      Dark Light Shape React APD   Right 3 2 Round Brisk None   Left 3 2 Round Brisk None       Visual Fields (Counting fingers)      Left Right    Full Full       Extraocular Movement      Right Left    Full, Ortho Full, Ortho       Neuro/Psych    Oriented x3: Yes   Mood/Affect: Normal       Dilation    Both eyes: 1.0% Mydriacyl, 2.5% Phenylephrine @ 8:06 AM        Slit Lamp and Fundus Exam    Slit Lamp Exam      Right Left   Lids/Lashes Dermatochalasis - upper lid Dermatochalasis - upper lid   Conjunctiva/Sclera White and quiet Trace Injection   Cornea Mild Arcus, mild EBMD, well healed temporal cataract wounds Mild Arcus, mild EBMD, well healed superior  cataract wounds   Anterior Chamber Deep and quiet Deep and quiet   Iris Round and dilated Round and dilated   Lens PC IOL in good position, 1+ non-central Posterior capsular opacification PC IOL in good position, trace Posterior capsular opacification   Vitreous Vitreous syneresis Vitreous syneresis, Posterior vitreous detachment, blood stained vitreous condensations--resolved       Fundus Exam      Right Left   Disc Pink and Sharp Pink and Sharp   C/D Ratio 0.6 0.65   Macula Flat, blunted foveal reflex, Retinal pigment epithelial mottling, No heme or edema Flat, blunted foveal reflex, Retinal pigment epithelial mottling, drusen, No heme or edema   Vessels Mild Vascular attenuation, Tortuousity Mild Vascular attenuation, Tortuousity, mild AV crossing changes   Periphery Attached    Attached, no RT/RD          IMAGING AND PROCEDURES  Imaging and Procedures for @TODAY @  OCT, Retina - OU - Both Eyes       Right Eye Quality was good. Central Foveal Thickness: 263. Progression has been stable. Findings include normal foveal contour, no IRF, no SRF.   Left Eye Quality was good. Central Foveal Thickness: 262. Progression has improved. Findings include normal foveal contour, no IRF, no SRF, retinal drusen  (Rare drusen, interval improvement in vitreous opacities).   Notes *Images captured and stored on drive  Diagnosis / Impression:  NFP, no IRF/SRF OU OS: Rare drusen, interval improvement in vitreous opacities  Clinical management:  See below  Abbreviations: NFP - Normal foveal profile. CME - cystoid macular edema. PED - pigment epithelial detachment. IRF - intraretinal fluid. SRF - subretinal fluid. EZ - ellipsoid zone. ERM - epiretinal membrane. ORA - outer retinal atrophy. ORT - outer retinal tubulation. SRHM - subretinal hyper-reflective material                 ASSESSMENT/PLAN:    ICD-10-CM   1. Posterior vitreous detachment of left eye  H43.812   2. Vitreous  hemorrhage of left eye (HCC)  H43.12   3. Retinal edema  H35.81 OCT, Retina - OU - Both Eyes  4. Essential hypertension  I10   5. Hypertensive retinopathy of both eyes  H35.033   6. Pseudophakia of both eyes  Z96.1     1,2. Hemorrhagic PVD OS             -  original onset ~01.10.21 -- was seen 01.15.21, then lost to f/u             - VH recurred on Thurs 03.11.21 -- presented here on 03.17.21             - FA 3.17.21 without significant leakage to suggest another etiology  - pt reports history of bruising on ASA 81 -- stopped after 02/27/20 visit here             - today, VH completely resolved             - Discussed findings and prognosis             - No RT or RD on 360 peripheral exam             - Reviewed s/s of RT/RD             - Strict return precautions for any such RT/RD signs/symptoms   - F/U 4 months -- DFE/OCT  3. No retinal edema on exam or OCT  4,5. Hypertensive retinopathy OU  - discussed importance of tight BP control  - monitor  6. Pseudophakia OU  - s/p CE/IOL OU (Dr. Herbert Deaner, 2017)  - doing well  - monitor   Ophthalmic Meds Ordered this visit:  No orders of the defined types were placed in this encounter.      Return in about 4 months (around 09/07/2020) for f/u hemorrhagic PVD OS, DFE, OCT.  There are no Patient Instructions on file for this visit.   Explained the diagnoses, plan, and follow up with the patient and they expressed understanding.  Patient expressed understanding of the importance of proper follow up care.   This document serves as a record of services personally performed by Gardiner Sleeper, MD, PhD. It was created on their behalf by Ernest Mallick, OA, an ophthalmic assistant. The creation of this record is the provider's dictation and/or activities during the visit.    Electronically signed by: Ernest Mallick, OA 05.26.2021 6:08 PM  Gardiner Sleeper, M.D., Ph.D. Diseases & Surgery of the Retina and [place] 05/07/2020   I have reviewed the above documentation for accuracy and completeness, and I agree with the above. Gardiner Sleeper, M.D., Ph.D. 05/10/20 6:08 PM   Abbreviations: M myopia (nearsighted); A astigmatism; H hyperopia (farsighted); P presbyopia; Mrx spectacle prescription;  CTL contact lenses; OD right eye; OS left eye; OU both eyes  XT exotropia; ET esotropia; PEK punctate epithelial keratitis; PEE punctate epithelial erosions; DES dry eye syndrome; MGD meibomian gland dysfunction; ATs artificial tears; PFAT's preservative free artificial tears; [place] nuclear sclerotic cataract; PSC posterior subcapsular cataract; ERM epi-retinal membrane; PVD posterior vitreous detachment; RD retinal detachment; DM diabetes mellitus; DR diabetic retinopathy; NPDR non-proliferative diabetic retinopathy; PDR proliferative diabetic retinopathy; CSME clinically significant macular edema; DME diabetic macular edema; dbh dot blot hemorrhages; CWS cotton wool spot; POAG primary open angle glaucoma; C/D cup-to-disc ratio; HVF humphrey visual field; GVF goldmann visual field; OCT optical coherence tomography; IOP intraocular pressure; BRVO Branch retinal vein occlusion; CRVO central retinal vein occlusion; CRAO central retinal artery occlusion; BRAO branch retinal artery occlusion; RT retinal tear; SB scleral buckle; PPV pars plana vitrectomy; VH Vitreous hemorrhage; PRP panretinal laser photocoagulation; IVK intravitreal kenalog; VMT vitreomacular traction; MH Macular hole;  NVD neovascularization of the disc; NVE neovascularization elsewhere; AREDS age related eye disease study; ARMD age related macular degeneration; POAG primary open angle glaucoma;  EBMD epithelial/anterior basement membrane dystrophy; ACIOL anterior chamber intraocular lens; IOL intraocular lens; PCIOL posterior chamber intraocular lens; Phaco/IOL phacoemulsification with intraocular lens placement; [place] photorefractive keratectomy; LASIK laser  assisted in situ keratomileusis; HTN hypertension; DM diabetes mellitus; COPD chronic obstructive pulmonary disease

## 2020-05-07 ENCOUNTER — Ambulatory Visit (INDEPENDENT_AMBULATORY_CARE_PROVIDER_SITE_OTHER): Payer: 59 | Admitting: Ophthalmology

## 2020-05-07 ENCOUNTER — Other Ambulatory Visit: Payer: Self-pay

## 2020-05-07 ENCOUNTER — Encounter (INDEPENDENT_AMBULATORY_CARE_PROVIDER_SITE_OTHER): Payer: Self-pay | Admitting: Ophthalmology

## 2020-05-07 DIAGNOSIS — I10 Essential (primary) hypertension: Secondary | ICD-10-CM | POA: Diagnosis not present

## 2020-05-07 DIAGNOSIS — H43812 Vitreous degeneration, left eye: Secondary | ICD-10-CM

## 2020-05-07 DIAGNOSIS — H35033 Hypertensive retinopathy, bilateral: Secondary | ICD-10-CM

## 2020-05-07 DIAGNOSIS — H4312 Vitreous hemorrhage, left eye: Secondary | ICD-10-CM | POA: Diagnosis not present

## 2020-05-07 DIAGNOSIS — H3581 Retinal edema: Secondary | ICD-10-CM

## 2020-05-07 DIAGNOSIS — Z961 Presence of intraocular lens: Secondary | ICD-10-CM

## 2020-09-10 ENCOUNTER — Encounter (INDEPENDENT_AMBULATORY_CARE_PROVIDER_SITE_OTHER): Payer: Medicare Other | Admitting: Ophthalmology

## 2021-07-23 DIAGNOSIS — G4733 Obstructive sleep apnea (adult) (pediatric): Secondary | ICD-10-CM | POA: Diagnosis not present

## 2021-08-20 DIAGNOSIS — D225 Melanocytic nevi of trunk: Secondary | ICD-10-CM | POA: Diagnosis not present

## 2021-08-20 DIAGNOSIS — D485 Neoplasm of uncertain behavior of skin: Secondary | ICD-10-CM | POA: Diagnosis not present

## 2021-08-20 DIAGNOSIS — Z1283 Encounter for screening for malignant neoplasm of skin: Secondary | ICD-10-CM | POA: Diagnosis not present

## 2021-09-02 DIAGNOSIS — L814 Other melanin hyperpigmentation: Secondary | ICD-10-CM | POA: Diagnosis not present

## 2021-09-02 DIAGNOSIS — D485 Neoplasm of uncertain behavior of skin: Secondary | ICD-10-CM | POA: Diagnosis not present

## 2021-09-02 DIAGNOSIS — L988 Other specified disorders of the skin and subcutaneous tissue: Secondary | ICD-10-CM | POA: Diagnosis not present

## 2021-10-07 DIAGNOSIS — H6123 Impacted cerumen, bilateral: Secondary | ICD-10-CM | POA: Diagnosis not present

## 2021-10-20 DIAGNOSIS — I10 Essential (primary) hypertension: Secondary | ICD-10-CM | POA: Diagnosis not present

## 2021-10-20 DIAGNOSIS — E7800 Pure hypercholesterolemia, unspecified: Secondary | ICD-10-CM | POA: Diagnosis not present

## 2021-10-20 DIAGNOSIS — R7301 Impaired fasting glucose: Secondary | ICD-10-CM | POA: Diagnosis not present

## 2021-10-22 DIAGNOSIS — Z23 Encounter for immunization: Secondary | ICD-10-CM | POA: Diagnosis not present

## 2021-10-22 DIAGNOSIS — I4891 Unspecified atrial fibrillation: Secondary | ICD-10-CM | POA: Diagnosis not present

## 2021-10-22 DIAGNOSIS — R7301 Impaired fasting glucose: Secondary | ICD-10-CM | POA: Diagnosis not present

## 2021-10-22 DIAGNOSIS — I10 Essential (primary) hypertension: Secondary | ICD-10-CM | POA: Diagnosis not present

## 2021-10-22 DIAGNOSIS — I499 Cardiac arrhythmia, unspecified: Secondary | ICD-10-CM | POA: Diagnosis not present

## 2021-10-22 DIAGNOSIS — Z0001 Encounter for general adult medical examination with abnormal findings: Secondary | ICD-10-CM | POA: Diagnosis not present

## 2021-10-22 DIAGNOSIS — K760 Fatty (change of) liver, not elsewhere classified: Secondary | ICD-10-CM | POA: Diagnosis not present

## 2021-11-03 DIAGNOSIS — I4891 Unspecified atrial fibrillation: Secondary | ICD-10-CM | POA: Diagnosis not present

## 2021-12-16 ENCOUNTER — Encounter: Payer: Self-pay | Admitting: *Deleted

## 2021-12-16 ENCOUNTER — Encounter: Payer: Self-pay | Admitting: Cardiology

## 2021-12-16 NOTE — Progress Notes (Signed)
Cardiology Office Note  Date: 12/17/2021   ID: MATIAS THURMAN, DOB 28-Jul-1951, MRN [id number]  PCP:  Curlene Labrum, MD  Cardiologist:  Rozann Lesches, MD Electrophysiologist:  None   Chief Complaint  Patient presents with   Atrial Fibrillation    History of Present Illness: MALACHI KINZLER is a 71 y.o. male referred for cardiology consultation by Dr. Pleas Koch for evaluation of atrial fibrillation.  He is here today with his wife.  He was diagnosed with atrial fibrillation at a routine office visit back in November 2022.  He has been asymptomatic in terms of any sense of palpitations and otherwise reports good physical stamina, no active angina, NYHA class I-II dyspnea.  He has been active throughout his life, played football at [employer] years ago, also previously an avid runner, completed 5 marathons.  His current exercise plan is going to the [facility] where he runs in the pool for an hour at a time, follows this with Roman chair set ups.  He has been watching his diet and trying to lose weight.  He has worked in Programmer, applications for many years, previously with [employer].  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  He underwent an echocardiogram as well as Lexicographer at [facility] in November 2022, results detailed below.  We discussed these results in detail today. . I personally reviewed his ECG from October 22, 2021 which shows rate controlled atrial fibrillation at 74 beats per minutes, rule out old inferior infarct pattern.  We also went over his medications.  He has been on Eliquis since diagnosis of atrial fibrillation back in November 2022.  CHA2DS2-VASc score is 4.  He does not report any spontaneous bleeding problems.  I personally reviewed his ECG today which shows atrial fibrillation, Q-wave in lead III, not diagnostic for inferior infarct.  Past Medical History:  Diagnosis Date   COVID-19    Essential hypertension    Lumbar spondylosis    Mixed  hyperlipidemia    OSA on CPAP    Prediabetes     Past Surgical History:  Procedure Laterality Date   CATARACT EXTRACTION Bilateral 2017   hecker, toric implants OU   CERVICAL SPINE SURGERY     REPLACEMENT TOTAL KNEE Right    REPLACEMENT TOTAL KNEE Left    TONSILLECTOMY AND ADENOIDECTOMY     VASECTOMY      Current Outpatient Medications  Medication Sig Dispense Refill   amLODipine (NORVASC) 10 MG tablet Take 10 mg by mouth daily.     apixaban (ELIQUIS) 5 MG TABS tablet Take 5 mg by mouth 2 (two) times daily.     Calcium Carb-Cholecalciferol (CALCIUM 600+D3 PO) Take 1 tablet by mouth daily.     cycloSPORINE (RESTASIS) 0.05 % ophthalmic emulsion Place 1 drop into both eyes 2 (two) times daily.     hydrochlorothiazide (HYDRODIURIL) 25 MG tablet Take 25 mg by mouth daily.     loratadine (CLARITIN) 10 MG tablet Take 10 mg by mouth daily.     losartan (COZAAR) 100 MG tablet Take 100 mg by mouth daily.     metroNIDAZOLE (METROGEL) 1 % gel as needed.     Misc Natural Products (PROSTATE HEALTH PO) Take 1 tablet by mouth daily.     Multiple Vitamin (MULTI-VITAMIN) tablet Take by mouth daily.     Omega 3 1000 MG CAPS Take 1 capsule by mouth 2 (two) times daily.     rosuvastatin (CRESTOR) 10 MG tablet Take  10 mg by mouth at bedtime.     No current facility-administered medications for this visit.   Allergies:  Patient has no known allergies.   Social History: The patient  reports that he has quit smoking. His smoking use included cigarettes. He has quit using smokeless tobacco.  His smokeless tobacco use included chew. He reports current alcohol use.   Family History: The patient's family history includes Atrial fibrillation in his brother and mother; Diabetes in his father; Hyperlipidemia in his brother and mother; Hypertension in his father and mother.   ROS: No palpitations or syncope.  Chronic knee pain.  Physical Exam: VS:  BP (!) 142/74    Pulse 66    Ht 6\' 2"  (1.88 m)    Wt (!)  302 lb (137 kg)    SpO2 95%    BMI 38.77 kg/m , BMI Body mass index is 38.77 kg/m.  Wt Readings from Last 3 Encounters:  12/17/21 (!) 302 lb (137 kg)    General: Patient appears comfortable at rest. HEENT: Conjunctiva and lids normal, wearing a mask. Neck: Supple, no elevated JVP or carotid bruits, no thyromegaly. Lungs: Clear to auscultation, nonlabored breathing at rest. Cardiac: Irregularly irregular, no S3 or significant systolic murmur, no pericardial rub. Abdomen: Soft, nontender, bowel sounds present. Extremities: No pitting edema, distal pulses 2+. Skin: Warm and dry. Musculoskeletal: No kyphosis. Neuropsychiatric: Alert and oriented x3, affect grossly appropriate.  ECG:  An ECG dated 01/04/2013 was personally reviewed today and demonstrated:  Sinus arrhythmia with prolonged PR interval, rule out old inferior infarct pattern.  Recent Labwork:  November 2022: BUN 17, creatinine 1.11, potassium 4.3, AST 23, ALT 28, cholesterol 124, triglycerides 105, HDL 36, LDL 68, hemoglobin A1c 6.1%  Other Studies Reviewed Today:  Echocardiogram 11/03/2021 [facility]): Summary    1. The left ventricle is normal in size with mildly increased wall  thickness.    2. The left ventricular systolic function is normal, LVEF is visually  estimated at > 55%.    3. The left atrium is mildly dilated in size.    4. The right ventricle is normal in size, with normal systolic function.   Lexiscan Myoview 11/09/2021 [facility]): 1. There is a small in size, moderate in severity defect involving  the apical anterior and apicolateral segments. The apical anterior  segment is nearly completely reversible. The apicolateral segment is  partially reversible.   2. Normal left ventricular wall motion.   3. Left ventricular ejection fraction 53%   4. Non invasive risk stratification*: Low   Assessment and Plan:  1.  Persistent atrial fibrillation, duration is uncertain but likely over a  period of several months per discussion today.  He could have had paroxysmal atrial fibrillation that was only recently caught in November 2022.  CHA2DS2-VASc score is 4.  He does not describe any limiting symptomatology at this point and his heart rate control at rest is reasonable.  Presently not requiring any AV nodal blockers.  Several risk factors for atrial fibrillation to consider including age, hypertension with mildly dilated left atrium, OSA on CPAP, prediabetes, and history of COVID-19.  He is on Eliquis for stroke prophylaxis, tolerating well at this point.  I reviewed his baseline lab work.  We discussed options for management including heart rate control strategy and anticoagulation versus attempts at cardioversion.  At this point, we will treat as permanent atrial fibrillation and continue to follow regularly.  Should have follow-up CBC and BMET within the next  6 months.  2.  Abnormal Lexiscan Myoview in November 2022.  Overall low risk study with area of potential apical anterolateral ischemia suggesting presence of underlying CAD.  We discussed this result today.  He has good functional capacity, exercises regularly without angina.  He is already on Crestor with LDL 68, also antihypertensive therapy including Norvasc, losartan, and hydrochlorothiazide.  For now we will continue observation and risk factor modification.  We did discuss warning signs and symptoms that would prompt further evaluation such as cardiac catheterization.  No aspirin at this point given concurrent use of Eliquis.  3.  OSA on CPAP.  He is also trying to lose some weight.  4.  History of prediabetes, last hemoglobin A1c was 6.1%.  He is working on diet and weight loss.  Keep follow-up with Dr. Pleas Koch.  Might be a good candidate for SGLT2 inhibitor.  5.  Essential hypertension, on losartan, hydrochlorothiazide, and Norvasc.  Medication Adjustments/Labs and Tests Ordered: Current medicines are reviewed at length with  the patient today.  Concerns regarding medicines are outlined above.   Tests Ordered: Orders Placed This Encounter  Procedures   EKG 12-Lead    Medication Changes: No orders of the defined types were placed in this encounter.   Disposition:  Follow up  6 months.  Signed, Satira Sark, MD, [facility] 12/17/2021 12:21 PM    [place] [facility] [address], [place], [place] [address] Phone: [phone]; Fax: [phone]

## 2021-12-17 ENCOUNTER — Encounter: Payer: Self-pay | Admitting: Cardiology

## 2021-12-17 ENCOUNTER — Ambulatory Visit: Payer: Medicare Other | Admitting: Cardiology

## 2021-12-17 VITALS — BP 142/74 | HR 66 | Ht 74.0 in | Wt 302.0 lb

## 2021-12-17 DIAGNOSIS — E782 Mixed hyperlipidemia: Secondary | ICD-10-CM

## 2021-12-17 DIAGNOSIS — R9439 Abnormal result of other cardiovascular function study: Secondary | ICD-10-CM | POA: Diagnosis not present

## 2021-12-17 DIAGNOSIS — I4891 Unspecified atrial fibrillation: Secondary | ICD-10-CM | POA: Diagnosis not present

## 2021-12-17 DIAGNOSIS — I10 Essential (primary) hypertension: Secondary | ICD-10-CM | POA: Diagnosis not present

## 2021-12-17 DIAGNOSIS — I4819 Other persistent atrial fibrillation: Secondary | ICD-10-CM | POA: Diagnosis not present

## 2021-12-17 NOTE — Patient Instructions (Addendum)

## 2022-01-21 DIAGNOSIS — H524 Presbyopia: Secondary | ICD-10-CM | POA: Diagnosis not present

## 2022-02-25 DIAGNOSIS — Z1283 Encounter for screening for malignant neoplasm of skin: Secondary | ICD-10-CM | POA: Diagnosis not present

## 2022-02-25 DIAGNOSIS — D225 Melanocytic nevi of trunk: Secondary | ICD-10-CM | POA: Diagnosis not present

## 2022-02-26 DIAGNOSIS — G4733 Obstructive sleep apnea (adult) (pediatric): Secondary | ICD-10-CM | POA: Diagnosis not present

## 2022-04-13 ENCOUNTER — Encounter: Payer: Self-pay | Admitting: Cardiology

## 2022-04-13 DIAGNOSIS — Z1159 Encounter for screening for other viral diseases: Secondary | ICD-10-CM | POA: Diagnosis not present

## 2022-04-13 DIAGNOSIS — E7800 Pure hypercholesterolemia, unspecified: Secondary | ICD-10-CM | POA: Diagnosis not present

## 2022-04-13 DIAGNOSIS — I10 Essential (primary) hypertension: Secondary | ICD-10-CM | POA: Diagnosis not present

## 2022-04-13 DIAGNOSIS — Z1329 Encounter for screening for other suspected endocrine disorder: Secondary | ICD-10-CM | POA: Diagnosis not present

## 2022-04-21 DIAGNOSIS — I10 Essential (primary) hypertension: Secondary | ICD-10-CM | POA: Diagnosis not present

## 2022-04-21 DIAGNOSIS — G4733 Obstructive sleep apnea (adult) (pediatric): Secondary | ICD-10-CM | POA: Diagnosis not present

## 2022-04-21 DIAGNOSIS — K760 Fatty (change of) liver, not elsewhere classified: Secondary | ICD-10-CM | POA: Diagnosis not present

## 2022-04-21 DIAGNOSIS — Z23 Encounter for immunization: Secondary | ICD-10-CM | POA: Diagnosis not present

## 2022-04-21 DIAGNOSIS — R7301 Impaired fasting glucose: Secondary | ICD-10-CM | POA: Diagnosis not present

## 2022-04-21 DIAGNOSIS — I4821 Permanent atrial fibrillation: Secondary | ICD-10-CM | POA: Diagnosis not present

## 2022-05-06 DIAGNOSIS — L308 Other specified dermatitis: Secondary | ICD-10-CM | POA: Diagnosis not present

## 2022-06-01 DIAGNOSIS — G4733 Obstructive sleep apnea (adult) (pediatric): Secondary | ICD-10-CM | POA: Diagnosis not present

## 2022-06-12 NOTE — Progress Notes (Unsigned)
Cardiology Office Note  Date: 06/16/2022   ID: Gary Morales, DOB 1951-06-23, MRN [id number]  PCP:  Curlene Labrum, MD  Cardiologist:  Rozann Lesches, MD Electrophysiologist:  None   Chief Complaint  Patient presents with   Cardiac follow-up    History of Present Illness: Gary Morales is a 71 y.o. male last seen in January.  He is here for a routine visit.  Reports no concerns with palpitations, no exertional chest pain or breathlessness beyond NYHA class II.  He still exercising regularly.  No spontaneous bleeding problems on Eliquis.  I did review his follow-up lab work from May as noted below.  He continues on Crestor with LDL 55.  Renal function normal.  Past Medical History:  Diagnosis Date   Atrial fibrillation ([place])    COVID-19    Essential hypertension    Lumbar spondylosis    Mixed hyperlipidemia    OSA on CPAP    Prediabetes     Past Surgical History:  Procedure Laterality Date   CATARACT EXTRACTION Bilateral 2017   hecker, toric implants OU   CERVICAL SPINE SURGERY     REPLACEMENT TOTAL KNEE Right    REPLACEMENT TOTAL KNEE Left    TONSILLECTOMY AND ADENOIDECTOMY     VASECTOMY      Current Outpatient Medications  Medication Sig Dispense Refill   amLODipine (NORVASC) 10 MG tablet Take 10 mg by mouth daily.     apixaban (ELIQUIS) 5 MG TABS tablet Take 5 mg by mouth 2 (two) times daily.     Calcium Carb-Cholecalciferol (CALCIUM 600+D3 PO) Take 1 tablet by mouth daily.     cycloSPORINE (RESTASIS) 0.05 % ophthalmic emulsion Place 1 drop into both eyes 2 (two) times daily.     hydrochlorothiazide (HYDRODIURIL) 25 MG tablet Take 25 mg by mouth daily.     losartan (COZAAR) 100 MG tablet Take 100 mg by mouth daily.     metroNIDAZOLE (METROGEL) 1 % gel as needed.     Misc Natural Products (PROSTATE HEALTH PO) Take 1 tablet by mouth daily.     Multiple Vitamin (MULTI-VITAMIN) tablet Take by mouth daily.     Omega 3 1000 MG CAPS Take 1 capsule by mouth 2  (two) times daily.     rosuvastatin (CRESTOR) 10 MG tablet Take 10 mg by mouth at bedtime.     loratadine (CLARITIN) 10 MG tablet Take 10 mg by mouth daily. (Patient not taking: Reported on 06/16/2022)     No current facility-administered medications for this visit.   Allergies:  Patient has no known allergies.   ROS: No orthopnea or PND.  No unexplained syncope.  Physical Exam: VS:  BP 136/82   Pulse 72   Ht '6\' 2"'$  (1.88 m)   Wt (!) 302 lb 6.4 oz (137.2 kg)   SpO2 95%   BMI 38.83 kg/m , BMI Body mass index is 38.83 kg/m.  Wt Readings from Last 3 Encounters:  06/16/22 (!) 302 lb 6.4 oz (137.2 kg)  12/17/21 (!) 302 lb (137 kg)    General: Patient appears comfortable at rest. Lungs: Clear to auscultation, nonlabored breathing at rest. Cardiac: Irregularly irregular, no S3 or significant systolic murmur, no pericardial rub. Extremities: No pitting edema.  ECG:  An ECG dated 12/17/2021 was personally reviewed today and demonstrated:  Atrial fibrillation, Q-wave in lead III.  Recent Labwork:  May 2023: BUN 12, creatinine 0.95, potassium 5.0, AST 35, ALT 44, cholesterol 119, triglycerides 131, HDL 41,  LDL 55, TSH 1.65  Other Studies Reviewed Today:  Echocardiogram 11/03/2021 [facility]): Summary    1. The left ventricle is normal in size with mildly increased wall  thickness.    2. The left ventricular systolic function is normal, LVEF is visually  estimated at > 55%.    3. The left atrium is mildly dilated in size.    4. The right ventricle is normal in size, with normal systolic function.    Lexiscan Myoview 11/09/2021 [facility]): 1. There is a small in size, moderate in severity defect involving  the apical anterior and apicolateral segments. The apical anterior  segment is nearly completely reversible. The apicolateral segment is  partially reversible.   2. Normal left ventricular wall motion.   3. Left ventricular ejection fraction 53%   4. Non invasive  risk stratification*: Low   Assessment and Plan:  1.  Persistent/permanent atrial fibrillation with CHA2DS2-VASc score of 4.  He is symptomatically stable with normal resting heart rate in the absence of AV nodal blockers.  Tolerating Eliquis for stroke prophylaxis, no bleeding problems reported.  I did review his recent lab work.  2.  Essential hypertension, on Norvasc, Cozaar, and HCTZ.  Systolic is in the 878M today.  No changes made to current regimen.  Continue exercise with eye toward weight loss.  3.  Ischemic heart disease based on abnormal Myoview in November 2022, low risk study with possible apical anterolateral ischemia and no active angina.  Continue observation, he is stable with current exercise plan.  Continue Crestor, recent LDL 55.  Medication Adjustments/Labs and Tests Ordered: Current medicines are reviewed at length with the patient today.  Concerns regarding medicines are outlined above.   Tests Ordered: No orders of the defined types were placed in this encounter.   Medication Changes: No orders of the defined types were placed in this encounter.   Disposition:  Follow up  6 months.  Signed, Satira Sark, MD, [facility] 06/16/2022 10:19 AM    [place] [facility] [place], [place], [place] [address] Phone: [phone]; Fax: [phone]

## 2022-06-16 ENCOUNTER — Encounter: Payer: Self-pay | Admitting: Cardiology

## 2022-06-16 ENCOUNTER — Ambulatory Visit: Payer: Medicare Other | Admitting: Cardiology

## 2022-06-16 VITALS — BP 136/82 | HR 72 | Ht 74.0 in | Wt 302.4 lb

## 2022-06-16 DIAGNOSIS — I10 Essential (primary) hypertension: Secondary | ICD-10-CM

## 2022-06-16 DIAGNOSIS — I259 Chronic ischemic heart disease, unspecified: Secondary | ICD-10-CM

## 2022-06-16 DIAGNOSIS — I4819 Other persistent atrial fibrillation: Secondary | ICD-10-CM

## 2022-06-16 NOTE — Patient Instructions (Signed)
Medication Instructions:  Continue all current medications.   Labwork: none  Testing/Procedures: none  Follow-Up: 6 months   Any Other Special Instructions Will Be Listed Below (If Applicable).   If you need a refill on your cardiac medications before your next appointment, please call your pharmacy.  

## 2022-07-29 ENCOUNTER — Other Ambulatory Visit: Payer: Self-pay | Admitting: *Deleted

## 2022-07-29 ENCOUNTER — Encounter: Payer: Self-pay | Admitting: *Deleted

## 2022-07-29 NOTE — Patient Outreach (Signed)
  Care Coordination   Initial Visit Note   07/29/2022 Name: NIHAR KLUS MRN: [id number] DOB: [birth year]  Roberto Scales is a 71 y.o. year old male who sees Burdine, Virgina Evener, MD for primary care. I spoke with  Roberto Scales by phone today  What matters to the patients health and wellness today?  Maintain management of A-fib and taking Eliquis.  Report he is in the donut hole and cost has increased to $170.  Wonders if there are any options for medication assistance. Aware of need for AWV, last seen in May by PCP.    Goals Addressed               This Visit's Progress     Maintain health (pt-stated)        Care Coordination Interventions: Counseled on increased risk of stroke due to Afib and benefits of anticoagulation for stroke prevention Counseled on bleeding risk associated with Eliquis and importance of self-monitoring for signs/symptoms of bleeding Assessed social determinant of health barriers         SDOH assessments and interventions completed:  Yes  SDOH Interventions Today    Flowsheet Row Most Recent Value  SDOH Interventions   Food Insecurity Interventions Intervention Not Indicated  Financial Strain Interventions Intervention Not Indicated  Housing Interventions Intervention Not Indicated  Transportation Interventions Intervention Not Indicated        Care Coordination Interventions Activated:  Yes  Care Coordination Interventions:  Yes, provided   Follow up plan: Referral made to pharmacy team    Encounter Outcome:  Pt. Visit Completed   Valente David, RN, MSN, [facility] Care Coordinator [phone]

## 2022-08-31 DIAGNOSIS — G4733 Obstructive sleep apnea (adult) (pediatric): Secondary | ICD-10-CM | POA: Diagnosis not present

## 2022-09-13 DIAGNOSIS — Z1283 Encounter for screening for malignant neoplasm of skin: Secondary | ICD-10-CM | POA: Diagnosis not present

## 2022-09-13 DIAGNOSIS — L820 Inflamed seborrheic keratosis: Secondary | ICD-10-CM | POA: Diagnosis not present

## 2022-09-13 DIAGNOSIS — D225 Melanocytic nevi of trunk: Secondary | ICD-10-CM | POA: Diagnosis not present

## 2022-09-13 DIAGNOSIS — L821 Other seborrheic keratosis: Secondary | ICD-10-CM | POA: Diagnosis not present

## 2022-09-13 DIAGNOSIS — D485 Neoplasm of uncertain behavior of skin: Secondary | ICD-10-CM | POA: Diagnosis not present

## 2022-09-29 DIAGNOSIS — D485 Neoplasm of uncertain behavior of skin: Secondary | ICD-10-CM | POA: Diagnosis not present

## 2022-09-29 DIAGNOSIS — D487 Neoplasm of uncertain behavior of other specified sites: Secondary | ICD-10-CM | POA: Diagnosis not present

## 2022-10-20 DIAGNOSIS — E7800 Pure hypercholesterolemia, unspecified: Secondary | ICD-10-CM | POA: Diagnosis not present

## 2022-10-20 DIAGNOSIS — K760 Fatty (change of) liver, not elsewhere classified: Secondary | ICD-10-CM | POA: Diagnosis not present

## 2022-10-20 DIAGNOSIS — R7301 Impaired fasting glucose: Secondary | ICD-10-CM | POA: Diagnosis not present

## 2022-10-20 DIAGNOSIS — I10 Essential (primary) hypertension: Secondary | ICD-10-CM | POA: Diagnosis not present

## 2022-10-25 DIAGNOSIS — Z23 Encounter for immunization: Secondary | ICD-10-CM | POA: Diagnosis not present

## 2022-10-25 DIAGNOSIS — Z0001 Encounter for general adult medical examination with abnormal findings: Secondary | ICD-10-CM | POA: Diagnosis not present

## 2022-10-25 DIAGNOSIS — I10 Essential (primary) hypertension: Secondary | ICD-10-CM | POA: Diagnosis not present

## 2022-10-25 DIAGNOSIS — K760 Fatty (change of) liver, not elsewhere classified: Secondary | ICD-10-CM | POA: Diagnosis not present

## 2022-10-25 DIAGNOSIS — I4821 Permanent atrial fibrillation: Secondary | ICD-10-CM | POA: Diagnosis not present

## 2022-10-25 DIAGNOSIS — R7301 Impaired fasting glucose: Secondary | ICD-10-CM | POA: Diagnosis not present

## 2022-10-25 DIAGNOSIS — E8881 Metabolic syndrome: Secondary | ICD-10-CM | POA: Diagnosis not present

## 2022-10-25 DIAGNOSIS — G4733 Obstructive sleep apnea (adult) (pediatric): Secondary | ICD-10-CM | POA: Diagnosis not present

## 2022-10-27 DIAGNOSIS — K760 Fatty (change of) liver, not elsewhere classified: Secondary | ICD-10-CM | POA: Diagnosis not present

## 2022-12-10 DIAGNOSIS — G4733 Obstructive sleep apnea (adult) (pediatric): Secondary | ICD-10-CM | POA: Diagnosis not present

## 2022-12-14 DIAGNOSIS — Z1212 Encounter for screening for malignant neoplasm of rectum: Secondary | ICD-10-CM | POA: Diagnosis not present

## 2022-12-14 DIAGNOSIS — Z1211 Encounter for screening for malignant neoplasm of colon: Secondary | ICD-10-CM | POA: Diagnosis not present

## 2022-12-19 LAB — COLOGUARD: COLOGUARD: NEGATIVE

## 2022-12-26 NOTE — Progress Notes (Unsigned)
Cardiology Office Note  Date: 12/27/2022   ID: Gary Morales, DOB 04-14-1951, MRN [id number]  PCP:  Curlene Labrum, MD  Cardiologist:  Rozann Lesches, MD Electrophysiologist:  None   Chief Complaint  Patient presents with   Cardiac follow-up    History of Present Illness: Gary Morales is a 72 y.o. male last seen in July 2023.  He is here for a routine visit.  Reports no palpitations or chest discomfort, no dizziness or syncope.  Continues to exercise each day with no change in stamina.  I reviewed his medications, he continues on Eliquis, reports no spontaneous bleeding problems.  I personally reviewed his ECG today which shows atrial fibrillation with leftward axis.  He had lab work in November 2023 which I also reviewed.  Past Medical History:  Diagnosis Date   Atrial fibrillation ([place])    COVID-19    Essential hypertension    Lumbar spondylosis    Mixed hyperlipidemia    OSA on CPAP    Prediabetes     Current Outpatient Medications  Medication Sig Dispense Refill   amLODipine (NORVASC) 10 MG tablet Take 10 mg by mouth daily.     apixaban (ELIQUIS) 5 MG TABS tablet Take 5 mg by mouth 2 (two) times daily.     augmented betamethasone dipropionate (DIPROLENE-AF) 0.05 % cream Apply topically 2 (two) times daily as needed.     Calcium Carb-Cholecalciferol (CALCIUM 600+D3 PO) Take 1 tablet by mouth daily.     hydrochlorothiazide (HYDRODIURIL) 25 MG tablet Take 25 mg by mouth daily.     losartan (COZAAR) 100 MG tablet Take 100 mg by mouth daily.     metroNIDAZOLE (METROGEL) 1 % gel as needed.     Misc Natural Products (PROSTATE HEALTH PO) Take 1 tablet by mouth daily.     Multiple Vitamin (MULTI-VITAMIN) tablet Take by mouth daily.     Omega 3 1000 MG CAPS Take 1 capsule by mouth 2 (two) times daily.     rosuvastatin (CRESTOR) 10 MG tablet Take 10 mg by mouth at bedtime.     No current facility-administered medications for this visit.   Allergies:  Patient has  no known allergies.   ROS: No syncope.  Physical Exam: VS:  BP (!) 146/98   Pulse 85   Ht '6\' 3"'$  (1.905 m)   Wt (!) 309 lb 3.2 oz (140.3 kg)   SpO2 95%   BMI 38.65 kg/m , BMI Body mass index is 38.65 kg/m.  Wt Readings from Last 3 Encounters:  12/27/22 (!) 309 lb 3.2 oz (140.3 kg)  06/16/22 (!) 302 lb 6.4 oz (137.2 kg)  12/17/21 (!) 302 lb (137 kg)    General: Patient appears comfortable at rest. HEENT: Conjunctiva and lids normal. Neck: Supple, no elevated JVP or carotid bruits. Lungs: Clear to auscultation, nonlabored breathing at rest. Cardiac: Irregularly irregular, no significant murmur or gallop.  ECG:  An ECG dated 06/16/2022 was personally reviewed today and demonstrated:  Atrial fibrillation with Q in lead III.  Recent Labwork:  November 2023: BUN 15, creatinine 1.04, potassium 4.3, AST 32, ALT 41, cholesterol 125, triglycerides 146, HDL 40, LDL 60  Other Studies Reviewed Today:  Echocardiogram 11/03/2021 [facility]): Summary    1. The left ventricle is normal in size with mildly increased wall  thickness.    2. The left ventricular systolic function is normal, LVEF is visually  estimated at > 55%.    3. The left atrium is  mildly dilated in size.    4. The right ventricle is normal in size, with normal systolic function.    Lexiscan Myoview 11/09/2021 [facility]): 1. There is a small in size, moderate in severity defect involving  the apical anterior and apicolateral segments. The apical anterior  segment is nearly completely reversible. The apicolateral segment is  partially reversible.   2. Normal left ventricular wall motion.   3. Left ventricular ejection fraction 53%   4. Non invasive risk stratification*: Low   Assessment and Plan:  1.  Permanent atrial fibrillation with CHA2DS2-VASc score of 4.  He is asymptomatic and has not required AV nodal blockers for heart rate control.  Continue Eliquis for stroke prophylaxis.  I reviewed his  interval lab work.  2.  Ischemic heart disease based on abnormal Myoview as outlined above.  He reports no angina with good exercise tolerance.  Continue observation at this point.  He is on Crestor and Norvasc.  Medication Adjustments/Labs and Tests Ordered: Current medicines are reviewed at length with the patient today.  Concerns regarding medicines are outlined above.   Tests Ordered: Orders Placed This Encounter  Procedures   EKG 12-Lead    Medication Changes: No orders of the defined types were placed in this encounter.   Disposition:  Follow up  6 months.  Signed, Satira Sark, MD, [facility] 12/27/2022 9:29 AM    [place] at [place], [place], [place] [address] Phone: [phone]; Fax: [phone]

## 2022-12-27 ENCOUNTER — Encounter: Payer: Self-pay | Admitting: Cardiology

## 2022-12-27 ENCOUNTER — Ambulatory Visit: Payer: Medicare Other | Attending: Cardiology | Admitting: Cardiology

## 2022-12-27 VITALS — BP 146/98 | HR 85 | Ht 75.0 in | Wt 309.2 lb

## 2022-12-27 DIAGNOSIS — I259 Chronic ischemic heart disease, unspecified: Secondary | ICD-10-CM

## 2022-12-27 DIAGNOSIS — I4821 Permanent atrial fibrillation: Secondary | ICD-10-CM

## 2022-12-27 NOTE — Patient Instructions (Signed)
Medication Instructions:  Your physician recommends that you continue on your current medications as directed. Please refer to the Current Medication list given to you today.   Labwork: None today  Testing/Procedures: None today  Follow-Up: 6 months  Any Other Special Instructions Will Be Listed Below (If Applicable).  If you need a refill on your cardiac medications before your next appointment, please call your pharmacy.  

## 2022-12-31 DIAGNOSIS — J329 Chronic sinusitis, unspecified: Secondary | ICD-10-CM | POA: Diagnosis not present

## 2022-12-31 DIAGNOSIS — R059 Cough, unspecified: Secondary | ICD-10-CM | POA: Diagnosis not present

## 2022-12-31 DIAGNOSIS — R030 Elevated blood-pressure reading, without diagnosis of hypertension: Secondary | ICD-10-CM | POA: Diagnosis not present

## 2023-01-31 DIAGNOSIS — X32XXXD Exposure to sunlight, subsequent encounter: Secondary | ICD-10-CM | POA: Diagnosis not present

## 2023-01-31 DIAGNOSIS — Z1283 Encounter for screening for malignant neoplasm of skin: Secondary | ICD-10-CM | POA: Diagnosis not present

## 2023-01-31 DIAGNOSIS — D225 Melanocytic nevi of trunk: Secondary | ICD-10-CM | POA: Diagnosis not present

## 2023-01-31 DIAGNOSIS — H43813 Vitreous degeneration, bilateral: Secondary | ICD-10-CM | POA: Diagnosis not present

## 2023-01-31 DIAGNOSIS — L570 Actinic keratosis: Secondary | ICD-10-CM | POA: Diagnosis not present

## 2023-01-31 DIAGNOSIS — H524 Presbyopia: Secondary | ICD-10-CM | POA: Diagnosis not present

## 2023-03-15 DIAGNOSIS — G4733 Obstructive sleep apnea (adult) (pediatric): Secondary | ICD-10-CM | POA: Diagnosis not present

## 2023-06-12 NOTE — Progress Notes (Unsigned)
    Cardiology Office Note  Date: 06/13/2023   ID: Gary Morales, DOB 1951/08/07, MRN [id number]  History of Present Illness: Gary Morales is a 72 y.o. male last seen in January.  He is here for a routine visit.  Reports no major change in status, does feel more short of breath in the high heat and humidity, but otherwise no increasing sense of palpitations and no exertional chest pain.  He has gained weight in the last 6 months, although his wife has been sick, was diagnosed with a neuroendocrine tumor and is following at [facility]  He states that she is starting to do better at this point.  I went over his medications.  He does not report any spontaneous bleeding problems on Eliquis.  Also tolerating Crestor with last LDL 60 in November 2023.  He continues to follow with Dr. Leandrew Koyanagi for primary care.  Physical Exam: VS:  BP 138/82   Pulse 66   Ht 6\' 2"  (1.88 m)   Wt (!) 316 lb 12.8 oz (143.7 kg)   SpO2 98%   BMI 40.67 kg/m , BMI Body mass index is 40.67 kg/m.  Wt Readings from Last 3 Encounters:  06/13/23 (!) 316 lb 12.8 oz (143.7 kg)  12/27/22 (!) 309 lb 3.2 oz (140.3 kg)  06/16/22 (!) 302 lb 6.4 oz (137.2 kg)    General: Patient appears comfortable at rest. HEENT: Conjunctiva and lids normal. Neck: Supple, no elevated JVP or carotid bruits. Lungs: Clear to auscultation, nonlabored breathing at rest. Cardiac: Regular rate and rhythm, no S3 or significant systolic murmur. Extremities: No pitting edema.  ECG:  An ECG dated 12/27/2022 was personally reviewed today and demonstrated:  Atrial fibrillation with leftward axis.  Labwork:  November 2023: BUN 15, creatinine 1.04, potassium 4.3, AST 32, ALT 41, cholesterol 125, triglycerides 146, HDL 40, LDL 60  Other Studies Reviewed Today:  No interval cardiac testing for review today.  Assessment and Plan:  1.  Permanent atrial fibrillation with CHA2DS2-VASc score of 4.  He is asymptomatic in terms of palpitations with  reasonable heart rate at baseline in the absence of AV nodal blockers.  Continue Eliquis for stroke prophylaxis, he reports no spontaneous bleeding problems.  2.  Ischemic heart disease with Lexiscan Myoview in November 2022 [facility]) demonstrating apical anterior and apical anterolateral ischemia with LVEF 55% by echocardiogram at the time.  He has been managed medically.  No active angina.  Continue Crestor.  3.  Mixed hyperlipidemia.  LDL 60 in November 2023.  He is doing well on Crestor 10 mg daily.  4.  Essential hypertension.  No change in current regimen including Norvasc and Cozaar.  He is also working on reducing his weight through diet and exercise.  Disposition:  Follow up  6 months.  Signed, Jonelle Sidle, M.D., F.A.C.C. [facility]

## 2023-06-13 ENCOUNTER — Encounter: Payer: Self-pay | Admitting: Cardiology

## 2023-06-13 ENCOUNTER — Ambulatory Visit: Payer: Medicare Other | Attending: Cardiology | Admitting: Cardiology

## 2023-06-13 VITALS — BP 138/82 | HR 66 | Ht 74.0 in | Wt 316.8 lb

## 2023-06-13 DIAGNOSIS — E782 Mixed hyperlipidemia: Secondary | ICD-10-CM | POA: Diagnosis not present

## 2023-06-13 DIAGNOSIS — I10 Essential (primary) hypertension: Secondary | ICD-10-CM

## 2023-06-13 DIAGNOSIS — I4821 Permanent atrial fibrillation: Secondary | ICD-10-CM | POA: Diagnosis not present

## 2023-06-13 DIAGNOSIS — I259 Chronic ischemic heart disease, unspecified: Secondary | ICD-10-CM

## 2023-06-13 NOTE — Patient Instructions (Addendum)

## 2023-06-30 DIAGNOSIS — G4733 Obstructive sleep apnea (adult) (pediatric): Secondary | ICD-10-CM | POA: Diagnosis not present

## 2023-08-22 DIAGNOSIS — L570 Actinic keratosis: Secondary | ICD-10-CM | POA: Diagnosis not present

## 2023-08-22 DIAGNOSIS — Z1283 Encounter for screening for malignant neoplasm of skin: Secondary | ICD-10-CM | POA: Diagnosis not present

## 2023-08-22 DIAGNOSIS — X32XXXD Exposure to sunlight, subsequent encounter: Secondary | ICD-10-CM | POA: Diagnosis not present

## 2023-08-22 DIAGNOSIS — D225 Melanocytic nevi of trunk: Secondary | ICD-10-CM | POA: Diagnosis not present

## 2023-09-06 DIAGNOSIS — I10 Essential (primary) hypertension: Secondary | ICD-10-CM | POA: Diagnosis not present

## 2023-09-06 DIAGNOSIS — R7301 Impaired fasting glucose: Secondary | ICD-10-CM | POA: Diagnosis not present

## 2023-09-06 DIAGNOSIS — Z131 Encounter for screening for diabetes mellitus: Secondary | ICD-10-CM | POA: Diagnosis not present

## 2023-09-13 DIAGNOSIS — I4821 Permanent atrial fibrillation: Secondary | ICD-10-CM | POA: Diagnosis not present

## 2023-09-13 DIAGNOSIS — R7301 Impaired fasting glucose: Secondary | ICD-10-CM | POA: Diagnosis not present

## 2023-09-13 DIAGNOSIS — G4733 Obstructive sleep apnea (adult) (pediatric): Secondary | ICD-10-CM | POA: Diagnosis not present

## 2023-09-13 DIAGNOSIS — Z23 Encounter for immunization: Secondary | ICD-10-CM | POA: Diagnosis not present

## 2023-09-13 DIAGNOSIS — R7989 Other specified abnormal findings of blood chemistry: Secondary | ICD-10-CM | POA: Diagnosis not present

## 2023-09-13 DIAGNOSIS — I10 Essential (primary) hypertension: Secondary | ICD-10-CM | POA: Diagnosis not present

## 2023-09-13 DIAGNOSIS — K760 Fatty (change of) liver, not elsewhere classified: Secondary | ICD-10-CM | POA: Diagnosis not present

## 2023-12-19 ENCOUNTER — Encounter: Payer: Self-pay | Admitting: Cardiology

## 2023-12-19 ENCOUNTER — Ambulatory Visit: Payer: Medicare Other | Attending: Cardiology | Admitting: Cardiology

## 2023-12-19 VITALS — BP 138/94 | HR 88 | Ht 75.0 in | Wt 326.0 lb

## 2023-12-19 DIAGNOSIS — I10 Essential (primary) hypertension: Secondary | ICD-10-CM | POA: Diagnosis not present

## 2023-12-19 DIAGNOSIS — I4821 Permanent atrial fibrillation: Secondary | ICD-10-CM | POA: Diagnosis not present

## 2023-12-19 DIAGNOSIS — I259 Chronic ischemic heart disease, unspecified: Secondary | ICD-10-CM | POA: Diagnosis not present

## 2023-12-19 DIAGNOSIS — E782 Mixed hyperlipidemia: Secondary | ICD-10-CM

## 2023-12-19 NOTE — Patient Instructions (Addendum)

## 2023-12-19 NOTE — Progress Notes (Signed)
    Cardiology Office Note  Date: 12/19/2023   ID: CHACE KLIPPEL, DOB 1951/10/23, MRN [id number]  History of Present Illness: Gary Morales is a 73 y.o. male last seen in July 2024. He is here for a follow-up visit.  Reports no specific change in dyspnea on exertion, generally NYHA class II with most activities.  No exertional chest pain, no palpitations or syncope.  He has gained weight over the last 6 months, under a lot of stress dealing with family illnesses.  His wife has neuroendocrine cancer and his son was diagnosed with pancreatic cancer in the interim.  I reviewed his medications.  Current cardiovascular regimen includes Eliquis, Norvasc, HCTZ, Cozaar, omega-3 supplements, and Crestor.  He reports compliance with therapy.  Blood pressure elevated today, we discussed weight loss and trying to get back to his regular exercise plan.  I reviewed his ECG today which shows atrial fibrillation with controlled ventricular response, decreased R wave progression, rule out old inferior infarct.  Physical Exam: VS:  BP (!) 138/94   Pulse 88   Ht 6' 3 (1.905 m)   Wt (!) 326 lb (147.9 kg)   SpO2 97%   BMI 40.75 kg/m , BMI Body mass index is 40.75 kg/m.  Wt Readings from Last 3 Encounters:  12/19/23 (!) 326 lb (147.9 kg)  06/13/23 (!) 316 lb 12.8 oz (143.7 kg)  12/27/22 (!) 309 lb 3.2 oz (140.3 kg)    General: Patient appears comfortable at rest. HEENT: Conjunctiva and lids normal. Neck: Supple, no elevated JVP. Lungs: Clear to auscultation, nonlabored breathing at rest. Cardiac: Irregular irregular, no S3 or significant systolic murmur.  ECG:  An ECG dated 12/27/2022 was personally reviewed today and demonstrated:  Atrial fibrillation with leftward axis.  Labwork:  November 2023: BUN 15, creatinine 1.04, potassium 4.3, AST 32, ALT 41, cholesterol 125, triglycerides 146, HDL 40, LDL 60   Other Studies Reviewed Today:  No interval cardiac testing for review today.  Assessment  and Plan:  1.  Permanent atrial fibrillation with CHA2DS2-VASc score of 4.  Asymptomatic without palpitations on current regimen, heart rate controlled in the absence of AV nodal blockers.  He has had no spontaneous bleeding problems on Eliquis.   2.  Ischemic heart disease with Lexiscan  Myoview in November 2022 [facility]) demonstrating apical anterior and apical anterolateral ischemia with LVEF 55% by echocardiogram at the time.  No increasing angina or change in baseline dyspnea on exertion.  ECG reviewed.  Continue observation unless symptoms escalate.  He is on Crestor.   3.  Mixed hyperlipidemia.  LDL 60 in November 2023.  Continue current dose of Crestor.   4.  Primary hypertension.  Blood pressure elevated today, we discussed weight loss and exercise.  He reports compliance with therapy including Norvasc, Cozaar, and HCTZ.  Disposition:  Follow up  6 months.  Signed, Jayson JUDITHANN Sierras, M.D., F.A.C.C. [facility] at [facility]

## 2024-01-24 DIAGNOSIS — H35033 Hypertensive retinopathy, bilateral: Secondary | ICD-10-CM | POA: Diagnosis not present

## 2024-01-24 DIAGNOSIS — H5201 Hypermetropia, right eye: Secondary | ICD-10-CM | POA: Diagnosis not present

## 2024-02-06 DIAGNOSIS — I10 Essential (primary) hypertension: Secondary | ICD-10-CM | POA: Diagnosis not present

## 2024-02-06 DIAGNOSIS — R739 Hyperglycemia, unspecified: Secondary | ICD-10-CM | POA: Diagnosis not present

## 2024-02-06 DIAGNOSIS — E039 Hypothyroidism, unspecified: Secondary | ICD-10-CM | POA: Diagnosis not present

## 2024-02-06 DIAGNOSIS — E7849 Other hyperlipidemia: Secondary | ICD-10-CM | POA: Diagnosis not present

## 2024-02-06 DIAGNOSIS — D72829 Elevated white blood cell count, unspecified: Secondary | ICD-10-CM | POA: Diagnosis not present

## 2024-02-13 DIAGNOSIS — I10 Essential (primary) hypertension: Secondary | ICD-10-CM | POA: Diagnosis not present

## 2024-02-13 DIAGNOSIS — Z0001 Encounter for general adult medical examination with abnormal findings: Secondary | ICD-10-CM | POA: Diagnosis not present

## 2024-02-13 DIAGNOSIS — I4891 Unspecified atrial fibrillation: Secondary | ICD-10-CM | POA: Diagnosis not present

## 2024-02-13 DIAGNOSIS — G4733 Obstructive sleep apnea (adult) (pediatric): Secondary | ICD-10-CM | POA: Diagnosis not present

## 2024-02-20 DIAGNOSIS — C44319 Basal cell carcinoma of skin of other parts of face: Secondary | ICD-10-CM | POA: Diagnosis not present

## 2024-02-20 DIAGNOSIS — Z1283 Encounter for screening for malignant neoplasm of skin: Secondary | ICD-10-CM | POA: Diagnosis not present

## 2024-02-20 DIAGNOSIS — X32XXXD Exposure to sunlight, subsequent encounter: Secondary | ICD-10-CM | POA: Diagnosis not present

## 2024-02-20 DIAGNOSIS — L570 Actinic keratosis: Secondary | ICD-10-CM | POA: Diagnosis not present

## 2024-02-20 DIAGNOSIS — D225 Melanocytic nevi of trunk: Secondary | ICD-10-CM | POA: Diagnosis not present

## 2024-04-05 DIAGNOSIS — Z08 Encounter for follow-up examination after completed treatment for malignant neoplasm: Secondary | ICD-10-CM | POA: Diagnosis not present

## 2024-04-05 DIAGNOSIS — Z85828 Personal history of other malignant neoplasm of skin: Secondary | ICD-10-CM | POA: Diagnosis not present

## 2024-06-08 DIAGNOSIS — I10 Essential (primary) hypertension: Secondary | ICD-10-CM | POA: Diagnosis not present

## 2024-06-08 DIAGNOSIS — K760 Fatty (change of) liver, not elsewhere classified: Secondary | ICD-10-CM | POA: Diagnosis not present

## 2024-06-08 DIAGNOSIS — R7301 Impaired fasting glucose: Secondary | ICD-10-CM | POA: Diagnosis not present

## 2024-06-08 DIAGNOSIS — Z0001 Encounter for general adult medical examination with abnormal findings: Secondary | ICD-10-CM | POA: Diagnosis not present

## 2024-06-11 DIAGNOSIS — G4733 Obstructive sleep apnea (adult) (pediatric): Secondary | ICD-10-CM | POA: Diagnosis not present

## 2024-06-11 DIAGNOSIS — K760 Fatty (change of) liver, not elsewhere classified: Secondary | ICD-10-CM | POA: Diagnosis not present

## 2024-06-11 DIAGNOSIS — I10 Essential (primary) hypertension: Secondary | ICD-10-CM | POA: Diagnosis not present

## 2024-06-22 DIAGNOSIS — R300 Dysuria: Secondary | ICD-10-CM | POA: Diagnosis not present

## 2024-06-22 DIAGNOSIS — R5383 Other fatigue: Secondary | ICD-10-CM | POA: Diagnosis not present

## 2024-06-22 DIAGNOSIS — R509 Fever, unspecified: Secondary | ICD-10-CM | POA: Diagnosis not present

## 2024-06-22 DIAGNOSIS — M47814 Spondylosis without myelopathy or radiculopathy, thoracic region: Secondary | ICD-10-CM | POA: Diagnosis not present

## 2024-06-22 DIAGNOSIS — R52 Pain, unspecified: Secondary | ICD-10-CM | POA: Diagnosis not present

## 2024-06-22 DIAGNOSIS — S20362A Insect bite (nonvenomous) of left front wall of thorax, initial encounter: Secondary | ICD-10-CM | POA: Diagnosis not present

## 2024-06-27 ENCOUNTER — Ambulatory Visit: Attending: Cardiology | Admitting: Cardiology

## 2024-06-27 ENCOUNTER — Encounter: Payer: Self-pay | Admitting: Cardiology

## 2024-06-27 VITALS — BP 132/80 | HR 84 | Ht 75.0 in | Wt 307.4 lb

## 2024-06-27 DIAGNOSIS — I10 Essential (primary) hypertension: Secondary | ICD-10-CM | POA: Diagnosis not present

## 2024-06-27 DIAGNOSIS — E782 Mixed hyperlipidemia: Secondary | ICD-10-CM | POA: Diagnosis not present

## 2024-06-27 DIAGNOSIS — I259 Chronic ischemic heart disease, unspecified: Secondary | ICD-10-CM

## 2024-06-27 DIAGNOSIS — I4821 Permanent atrial fibrillation: Secondary | ICD-10-CM | POA: Diagnosis not present

## 2024-06-27 NOTE — Patient Instructions (Addendum)

## 2024-06-27 NOTE — Progress Notes (Signed)
    Cardiology Office Note  Date: 06/27/2024   ID: Gary Morales, DOB 03-28-1951, MRN [id number]  History of Present Illness: Gary Morales is a 73 y.o. male last seen in January.  He is here for a routine visit.  Reports no significant palpitations or exertional chest pain.  He has modified his diet by significantly cutting back carbohydrates, also exercising.  He has lost at least 20 pounds.  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  We went over his medications.  He does not report any spontaneous bleeding problems on Eliquis.  Blood pressure control reasonable today.  I reviewed his recent lab work.  Physical Exam: VS:  BP 132/80   Pulse 84   Ht 6' 3 (1.905 m)   Wt (!) 307 lb 6.4 oz (139.4 kg)   SpO2 97%   BMI 38.42 kg/m , BMI Body mass index is 38.42 kg/m.  Wt Readings from Last 3 Encounters:  06/27/24 (!) 307 lb 6.4 oz (139.4 kg)  12/19/23 (!) 326 lb (147.9 kg)  06/13/23 (!) 316 lb 12.8 oz (143.7 kg)    General: Patient appears comfortable at rest. HEENT: Conjunctiva and lids normal. Neck: Supple, no elevated JVP or carotid bruits. Lungs: Clear to auscultation, nonlabored breathing at rest. Cardiac: Irregular irregular, no significant murmur or gallop.  ECG:  An ECG dated 12/19/2023 was personally reviewed today and demonstrated:  Atrial fibrillation with decreased R wave progression, rule out old inferior infarct.  Labwork:  July 2025: BUN 26, creatinine 2.03, GFR 34, potassium 3.8, AST 139, ALT 96  Other Studies Reviewed Today:  No interval cardiac testing for review today.  Assessment and Plan:  1.  Permanent atrial fibrillation with CHA2DS2-VASc score of 4.  He is asymptomatic, no active palpitations.  Continue Eliquis 5 mg twice daily for stroke prophylaxis.  Not requiring any AV nodal blockers for heart rate control.   2.  Ischemic heart disease with Lexiscan  Myoview in November 2022 [facility])  demonstrating apical anterior and apical anterolateral ischemia with LVEF 55% by echocardiogram at the time.  No angina.  Continue Crestor 10 mg daily.   3.  Mixed hyperlipidemia.  He continues on Crestor 10 mg daily and omega-3 supplements.  Keep follow-up lab work with PCP.   4.  Primary hypertension.  No change in current regimen, currently on Norvasc 10 mg daily, Cozaar 100 mg daily, and HCTZ 25 mg daily.  5.  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  Disposition:  Follow up 6 months.  Signed, Jayson JUDITHANN Sierras, M.D., F.A.C.C. [facility] at [facility]

## 2024-06-28 DIAGNOSIS — K760 Fatty (change of) liver, not elsewhere classified: Secondary | ICD-10-CM | POA: Diagnosis not present

## 2024-06-28 DIAGNOSIS — R5383 Other fatigue: Secondary | ICD-10-CM | POA: Diagnosis not present

## 2024-06-28 DIAGNOSIS — I10 Essential (primary) hypertension: Secondary | ICD-10-CM | POA: Diagnosis not present

## 2024-07-04 DIAGNOSIS — R748 Abnormal levels of other serum enzymes: Secondary | ICD-10-CM | POA: Diagnosis not present

## 2024-09-20 ENCOUNTER — Other Ambulatory Visit: Payer: Self-pay | Admitting: Medical Genetics

## 2024-10-04 DIAGNOSIS — C44319 Basal cell carcinoma of skin of other parts of face: Secondary | ICD-10-CM | POA: Diagnosis not present

## 2024-10-04 DIAGNOSIS — Z1283 Encounter for screening for malignant neoplasm of skin: Secondary | ICD-10-CM | POA: Diagnosis not present

## 2024-10-04 DIAGNOSIS — D225 Melanocytic nevi of trunk: Secondary | ICD-10-CM | POA: Diagnosis not present

## 2024-10-04 DIAGNOSIS — L570 Actinic keratosis: Secondary | ICD-10-CM | POA: Diagnosis not present

## 2024-10-04 DIAGNOSIS — X32XXXD Exposure to sunlight, subsequent encounter: Secondary | ICD-10-CM | POA: Diagnosis not present

## 2024-12-31 ENCOUNTER — Ambulatory Visit: Admitting: Cardiology

## 2025-01-22 ENCOUNTER — Ambulatory Visit: Admitting: Cardiology
